# Patient Record
Sex: Female | Born: 1970
Health system: Southern US, Community
[De-identification: ages and names within clinical notes are randomized; demographics above are authoritative.]

## PROBLEM LIST (undated history)

## (undated) DIAGNOSIS — T783XXA Angioneurotic edema, initial encounter: Secondary | ICD-10-CM

## (undated) DIAGNOSIS — L509 Urticaria, unspecified: Secondary | ICD-10-CM

## (undated) DIAGNOSIS — D219 Benign neoplasm of connective and other soft tissue, unspecified: Secondary | ICD-10-CM

## (undated) DIAGNOSIS — E079 Disorder of thyroid, unspecified: Secondary | ICD-10-CM

## (undated) HISTORY — PX: UTERINE FIBROID SURGERY: SHX826

## (undated) HISTORY — DX: Disorder of thyroid, unspecified: E07.9

## (undated) HISTORY — DX: Benign neoplasm of connective and other soft tissue, unspecified: D21.9

## (undated) HISTORY — DX: Urticaria, unspecified: L50.9

## (undated) HISTORY — DX: Angioneurotic edema, initial encounter: T78.3XXA

---

## 2001-05-04 ENCOUNTER — Other Ambulatory Visit: Admission: RE | Admit: 2001-05-04 | Discharge: 2001-05-04 | Payer: Self-pay | Admitting: Obstetrics and Gynecology

## 2001-05-07 ENCOUNTER — Ambulatory Visit (HOSPITAL_COMMUNITY): Admission: RE | Admit: 2001-05-07 | Discharge: 2001-05-07 | Payer: Self-pay | Admitting: Internal Medicine

## 2001-05-07 ENCOUNTER — Encounter: Payer: Self-pay | Admitting: Obstetrics and Gynecology

## 2001-06-12 ENCOUNTER — Encounter: Payer: Self-pay | Admitting: Obstetrics and Gynecology

## 2001-06-12 ENCOUNTER — Ambulatory Visit (HOSPITAL_COMMUNITY): Admission: RE | Admit: 2001-06-12 | Discharge: 2001-06-12 | Payer: Self-pay | Admitting: Internal Medicine

## 2001-12-07 ENCOUNTER — Encounter: Payer: Self-pay | Admitting: Internal Medicine

## 2001-12-07 ENCOUNTER — Ambulatory Visit (HOSPITAL_COMMUNITY): Admission: RE | Admit: 2001-12-07 | Discharge: 2001-12-07 | Payer: Self-pay | Admitting: Internal Medicine

## 2004-12-03 ENCOUNTER — Ambulatory Visit (HOSPITAL_COMMUNITY): Admission: RE | Admit: 2004-12-03 | Discharge: 2004-12-03 | Payer: Self-pay | Admitting: Family Medicine

## 2004-12-27 ENCOUNTER — Ambulatory Visit: Payer: Self-pay | Admitting: Orthopedic Surgery

## 2005-01-01 ENCOUNTER — Encounter (HOSPITAL_COMMUNITY): Admission: RE | Admit: 2005-01-01 | Discharge: 2005-02-06 | Payer: Self-pay | Admitting: Orthopedic Surgery

## 2005-01-28 ENCOUNTER — Ambulatory Visit: Payer: Self-pay | Admitting: Orthopedic Surgery

## 2005-02-12 ENCOUNTER — Encounter (HOSPITAL_COMMUNITY): Admission: RE | Admit: 2005-02-12 | Discharge: 2005-03-14 | Payer: Self-pay | Admitting: Orthopedic Surgery

## 2005-12-18 ENCOUNTER — Ambulatory Visit (HOSPITAL_COMMUNITY): Admission: RE | Admit: 2005-12-18 | Discharge: 2005-12-18 | Payer: Self-pay | Admitting: Obstetrics and Gynecology

## 2006-03-06 ENCOUNTER — Inpatient Hospital Stay (HOSPITAL_COMMUNITY): Admission: RE | Admit: 2006-03-06 | Discharge: 2006-03-08 | Payer: Self-pay | Admitting: Obstetrics and Gynecology

## 2006-03-06 ENCOUNTER — Encounter (INDEPENDENT_AMBULATORY_CARE_PROVIDER_SITE_OTHER): Payer: Self-pay | Admitting: Specialist

## 2006-05-22 ENCOUNTER — Ambulatory Visit (HOSPITAL_COMMUNITY): Admission: RE | Admit: 2006-05-22 | Discharge: 2006-05-22 | Payer: Self-pay | Admitting: Obstetrics and Gynecology

## 2006-08-24 ENCOUNTER — Emergency Department (HOSPITAL_COMMUNITY): Admission: EM | Admit: 2006-08-24 | Discharge: 2006-08-24 | Payer: Self-pay | Admitting: Emergency Medicine

## 2007-08-07 ENCOUNTER — Other Ambulatory Visit: Admission: RE | Admit: 2007-08-07 | Discharge: 2007-08-07 | Payer: Self-pay | Admitting: Obstetrics and Gynecology

## 2008-03-30 ENCOUNTER — Ambulatory Visit (HOSPITAL_COMMUNITY): Admission: RE | Admit: 2008-03-30 | Discharge: 2008-03-30 | Payer: Self-pay | Admitting: Family Medicine

## 2008-08-09 ENCOUNTER — Other Ambulatory Visit: Admission: RE | Admit: 2008-08-09 | Discharge: 2008-08-09 | Payer: Self-pay | Admitting: Obstetrics and Gynecology

## 2008-11-29 ENCOUNTER — Ambulatory Visit (HOSPITAL_COMMUNITY): Admission: RE | Admit: 2008-11-29 | Discharge: 2008-11-29 | Payer: Self-pay | Admitting: Obstetrics and Gynecology

## 2009-08-30 ENCOUNTER — Other Ambulatory Visit: Admission: RE | Admit: 2009-08-30 | Discharge: 2009-08-30 | Payer: Self-pay | Admitting: Obstetrics and Gynecology

## 2009-11-29 ENCOUNTER — Ambulatory Visit (HOSPITAL_COMMUNITY): Admission: RE | Admit: 2009-11-29 | Discharge: 2009-11-29 | Payer: Self-pay | Admitting: Internal Medicine

## 2010-06-29 NOTE — H&P (Signed)
Tiffany Underwood, Tiffany Underwood               ACCOUNT NO.:  [id number]   MEDICAL RECORD NO.:  [id number]          PATIENT TYPE:  INP   LOCATION:  A417                          FACILITY:  [facility]   PHYSICIAN:  Tilda Burrow, M.D. DATE OF BIRTH:  08/05/1970   DATE OF ADMISSION:  03/06/2006  DATE OF DISCHARGE:  LH                              HISTORY & PHYSICAL   CHIEF COMPLAINT:  Symptomatic uterine fibroids, 14-16 week size.   HISTORY OF PRESENT ILLNESS:  This 40 year old female gravida 1 para 0  AB1 with ultrasound-confirmed large uterine fibroids is admitted for  multiple myomectomy to solve severe pelvic pain and eliminate fibroids.  Future fertility is desired, so she is not a hysterectomy candidate.  She is in the follicular phase of her cycle, with LMP February 26, 2006.  She has had a hysterosalpingogram confirming that there are no fibroids  inside the endometrium, but there is deformity of the endometrial cavity  due to posterior and fundal fibroids, with flattening of the contours of  the uterus.   PAST MEDICAL HISTORY:  Hypothyroidism.   SURGICAL HISTORY:  D&C in 68 for miscarriage.   SOCIAL HISTORY:  Married.  Employed.  Stable relationship x years.   DRUG ALLERGIES:  None known.   MEDICATIONS:  Synthroid 0.15 mg daily.   PHYSICAL EXAMINATION:  GENERAL:  Healthy-appearing African American  female, alert, oriented x 3.  Height 5 feet 5 inches, weight 167 pounds,  blood pressure 140/90, pulse 70s, respirations 16.  HEENT:  Pupils equal, round, and reactive.  NECK:  Supple.  CHEST:  Clear to auscultation.  ABDOMEN:  Nontender, with fundus of the uterus palpable to the right of  the midline for the largest of the fibroids.  Tender to palpation at our  pressure contact on the abdomen.  PELVIC:  Cervix normal.  GC and Chlamydia cultures recently negative.  Good pelvic support.  Normal external genitalia.  EXTREMITIES:  Within normal limits.   LABORATORY DATA:  Hemoglobin 13,  hematocrit 40.  Albumin 3.5.  Blood  type A positive.  Quantitative HCG negative.   IMPRESSION:  Symptomatic uterine fibroids.   PLAN:  Multiple myomectomy.      Tilda Burrow, M.D.  Electronically Signed     JVF/MEDQ  D:  03/07/2006  T:  03/07/2006  Job:  [id number]   cc:   [facility]

## 2010-06-29 NOTE — Op Note (Signed)
Tiffany Underwood, Tiffany Underwood               ACCOUNT NO.:  [id number]   MEDICAL RECORD NO.:  [id number]          PATIENT TYPE:  INP   LOCATION:  A417                          FACILITY:  [facility]   PHYSICIAN:  Tilda Burrow, M.D. DATE OF BIRTH:  1970/12/06   DATE OF PROCEDURE:  03/06/2006  DATE OF DISCHARGE:                               OPERATIVE REPORT   PREOPERATIVE DIAGNOSIS:  Symptomatic uterine fibroids, 14 weeks' size.   POSTOPERATIVE DIAGNOSIS:  Symptomatic uterine fibroids, 14 weeks' size.   PROCEDURE:  Multiple myomectomy (x5).   SURGEON:  Tilda Burrow, M.D.   ASSISTANTAnnabell Howells, R.N.   ANESTHESIA:  General.   COMPLICATIONS:  None.   FINDINGS:  Posterior fundal intramural fibroid 3 cm in size, anterior  lower uterine segment fibroid 1 x 1 x 2 cm, three apical fibroids in the  fundus portion, the largest of which, baseball-sized, located very close  to the insertion of the right tube and to the uterine body.   DETAILS OF PROCEDURE:  The patient was taken to the operating room,  prepped and draped for lower abdominal surgery with Pfannenstiel  incision performed in standard fashion with the use and elastic  retractor positioned for bowel position.  A few small thin, filmy  adhesions from the omentum to the anterior abdominal wall were  encountered and released.  Attention was directed to the uterus, which  could be elevated out of the pelvis and inspected.  There was a huge,  large, broad-based fibroid at the right cornual area just in front of  and medial to the insertion of the right fallopian tube.  There were two  additional pedunculated fibroids on the fundus and then palpable  intramural fibroids anteriorly and posteriorly as described in the HPI.  The larger fibroid was left until last.  The two fundal fibroids were  able to be taken down without difficulty.  The two fundal broad-based  pedunculated fibroids were taken down without difficulty.  Dexon 2-0 was  sewn  just beneath the peritoneum at the base of the stalk and used to  ligate these two with the with needle-needle tip cautery used.  The  stump could be then inverted to leave maximal peritoneal covering of the  stump on the uterine apex.  The posterior fibroid was then addressed.  By going to the midline on the posterior fundal side of the uterus,  making a 1.5 cm long J-shaped incision in the midline posterior portion  of the uterine body, reaching the fibroid, which was able to be grasped  at one end with an Allis clamp and gradually extracted while being  careful and peel it out minimal disruption of myometrium.  The sides  were pulled together in the midline with 2-0 and 3-0 Dexon sutures, with  excellent hemostasis.  Prior to the dissection, the myometrium was  infiltrated with dilute Pitressin solution 20 units and 100 mL for  improved hemostasis.  Again, the peritoneal surfaces were closed in a  subcuticular fashion with minimal exposure of the edges.   On the anterior lower uterine segment, there was  a similar fibroid  located about midway in the length of the uterus.  Again, in order to  access this a small J-shaped incision approximately 1 cm in length was  performed and the fibroid identified.  A small suture of 0 chromic  sutured into the body of the fibroid and placed on traction and then  careful peeling out of the fibroid from the anterior midportion to the  midportion of the uterine body.  There was no suspicion of entering of  the peritoneal cavity.  The potential space was obliterated with 2-0 and  3-0 Dexon in a similar fashion with good hemostasis achieved.  Again,  Pitressin had been used prior to the procedure.   Finally the large fundal fibroid was addressed.  It had extensive  varicosities over its surface and protruded from a large base  approximately 3 cm in diameter, with fibroid itself measuring  approximately 7-8 cm in diameter.  The peritoneum was opened in  an  elliptical fashion and then the body of the fibroid identified.  Careful  sharp dissection with Metzenbaum scissors and needle-tip cautery as  necessary was then performed to achieve excision of the specimen.  Particular care was taken to use sharp dissection in the area closest to  the fallopian tube on that side.  We were able to peel it out with  apparent avoidance of the tube itself.  The base, however, showed a  rather significant tendency to ooze and so the closure of the base of  the specimen required placement of interrupted and running 2-0 and 3-0  Dexon sutures placed in such a way as to be obscured by the peritoneum.  We were particularly careful to avoid the area of where the tube was  felt lie, though there was some oozing in this area and distortion was  present.  The peritoneal surface was quite redundant in this area after  the peeling out of the fibroid and was trimmed to reduce the overlap.  Nonetheless, there was plenty and the peritoneum was repositioned to  cover over the pedicle base with as careful attention to hemostasis as  possible.  The varicosities along the surface did require point cautery  from the underneath side prior to closure.  At the end of the case, the  surfaces were erythematous but with no major disruptions of the  peritoneal surfaces.  Irrigation was performed.  Inspection visually  showed hemostasis to be good at completion of the procedure with  approximately 100 mL estimated blood loss, almost all from the right  cornual area.  The patient then had irrigation of the pelvis with saline  left intraperitoneal, with perioperative antibiotics administered.  Sponge and needle counts correct.  The closure of the peritoneal cavity  was easily accomplished with 2-0 Dexon closure of the peritoneum, 0  Vicryl closure of the fascia, irrigation and inspection of the subcu fatty space with closure of the potential space and staple closure of  the skin.   Sponge and needle counts were correct.  The patient tolerated  the procedure well, went to the recovery room in good condition.,      Tilda Burrow, M.D.  Electronically Signed     JVF/MEDQ  D:  03/07/2006  T:  03/07/2006  Job:  [id number]

## 2010-06-29 NOTE — Discharge Summary (Signed)
NAMENYJAI, GRAFF               ACCOUNT NO.:  [id number]   MEDICAL RECORD NO.:  [id number]          PATIENT TYPE:  INP   LOCATION:  A417                          FACILITY:  [facility]   PHYSICIAN:  Lazaro Arms, M.D.   DATE OF BIRTH:  [birth year]   DATE OF ADMISSION:  03/06/2006  DATE OF DISCHARGE:  01/26/2008LH                               DISCHARGE SUMMARY   DISCHARGE DIAGNOSIS:  Status post abdominal myomectomy.   PROCEDURES:  Abdominal myomectomy, multiple.   Please refer to the history and physical for details of admission to the  hospital.   HOSPITAL COURSE:  The patient was admitted to undergo multiple  myomectomy due to a symptomatic 14 to 16 weeks size uterus.  Her  hemoglobin preoperatively was 13 and her hematocrit was 40.  Her  interoperative course was unremarkable.  Postoperative day one, she had  a hemoglobin 11.5 and hematocrit 34.7, and a white count of 8900.  She  was afebrile throughout the postoperative course.  She tolerated clear  liquids and then a regular diet.  She voided without symptoms.  She  tolerated her transitional oral pain medicine and she was extensively  ambulatory.  Additionally, she had flatus prior to discharge.  On the  morning of discharge, her abdomen was benign, soft, appropriate for  postop, her incision is clean, dry, and intact.  She was discharged to  home on March 08, 2006, postop day two, to follow up in the office  next Friday to have her staples removed and the incision evaluated.  She  has been given instructions and precautions for return prior to that  time.  She is given Vicodin #40 and Phenergan #12 as discharge  medications.      Lazaro Arms, M.D.  Electronically Signed     LHE/MEDQ  D:  03/08/2006  T:  03/08/2006  Job:  [id number]

## 2010-06-29 NOTE — Op Note (Signed)
Tiffany Underwood, Tiffany Underwood               ACCOUNT NO.:  [id number]   MEDICAL RECORD NO.:  [id number]           PATIENT TYPE:  OUT   LOCATION:  RAD                           FACILITY:  [facility]   PHYSICIAN:  Tilda Burrow, M.D. DATE OF BIRTH:  Mar 28, 1970   DATE OF PROCEDURE:  12/18/2005  DATE OF DISCHARGE:                                 OPERATIVE REPORT   This is a hysterosalpingogram performed in radiology on December 18, 2005.   PROCEDURE INDICATION:  Known uterine fibroids, 14 weeks size, having  evaluation for consideration of multiple myomectomy.  The patient has been  on Nuvaring until recently and had a chest tube performed.   Exam shows good vaginal length.  Cervix is nonpurulent.  It is prepped and  draped and grasped with a single tooth tenaculum with catheterization of the  endocervical canal performed.  HSG has been initiated with prompt spillage  out each tube at 3-4 mL of fluid volume.  The uterus is manipulated around  and as the cervix comes anteriorly there is some flattening of the posterior  aspects of the uterine fundus due to the presence of the posterior  intramural fibroid.  There is some slight shaggy irregularity on the left  side felt to represent endometrial decidua as she has only recently stopped  her Nuvaring, and she has not actually had a period since her quantitative  HCG of course was negative yesterday.   IMPRESSION:  1. Bilateral tubal patency.  2. Symmetric flattening of the posterior uterine fundus secondary to      enlarged fibroids.  3. No evidence of polypoid fibroids or anything severely deforming the      endometrial cavity.   PLAN:  Given the large size of the uterine fibroids myomectomy is to be  considered.  We will discuss with the patient and follow up next week.  Doxycycline 100 mg b.i.d. x5 days given to the patient.      Tilda Burrow, M.D.  Electronically Signed     JVF/MEDQ  D:  12/23/2005  T:  12/23/2005  Job:  [id number]

## 2010-09-17 ENCOUNTER — Other Ambulatory Visit: Payer: Self-pay | Admitting: Obstetrics and Gynecology

## 2010-09-19 ENCOUNTER — Other Ambulatory Visit (HOSPITAL_COMMUNITY)
Admission: RE | Admit: 2010-09-19 | Discharge: 2010-09-19 | Disposition: A | Discharge status: Home / Self Care | Payer: BC Managed Care – PPO | Source: Ambulatory Visit | Attending: Obstetrics and Gynecology | Admitting: Obstetrics and Gynecology

## 2010-09-19 ENCOUNTER — Other Ambulatory Visit: Payer: Self-pay | Admitting: Obstetrics and Gynecology

## 2010-09-19 DIAGNOSIS — Z01419 Encounter for gynecological examination (general) (routine) without abnormal findings: Secondary | ICD-10-CM | POA: Insufficient documentation

## 2011-06-04 ENCOUNTER — Other Ambulatory Visit: Payer: Self-pay | Admitting: Obstetrics and Gynecology

## 2011-06-04 DIAGNOSIS — Z139 Encounter for screening, unspecified: Secondary | ICD-10-CM

## 2011-06-10 ENCOUNTER — Ambulatory Visit (HOSPITAL_COMMUNITY)
Admission: RE | Admit: 2011-06-10 | Discharge: 2011-06-10 | Disposition: A | Discharge status: Home / Self Care | Payer: BC Managed Care – PPO | Source: Ambulatory Visit | Attending: Obstetrics and Gynecology | Admitting: Obstetrics and Gynecology

## 2011-06-10 DIAGNOSIS — Z1231 Encounter for screening mammogram for malignant neoplasm of breast: Secondary | ICD-10-CM | POA: Insufficient documentation

## 2011-06-10 DIAGNOSIS — Z139 Encounter for screening, unspecified: Secondary | ICD-10-CM

## 2011-08-17 ENCOUNTER — Emergency Department (HOSPITAL_COMMUNITY)
Admission: EM | Admit: 2011-08-17 | Discharge: 2011-08-17 | Disposition: A | Discharge status: Home / Self Care | Payer: BC Managed Care – PPO | Attending: Emergency Medicine | Admitting: Emergency Medicine

## 2011-08-17 ENCOUNTER — Encounter (HOSPITAL_COMMUNITY): Payer: Self-pay | Admitting: *Deleted

## 2011-08-17 DIAGNOSIS — X58XXXA Exposure to other specified factors, initial encounter: Secondary | ICD-10-CM | POA: Insufficient documentation

## 2011-08-17 DIAGNOSIS — S0920XA Traumatic rupture of unspecified ear drum, initial encounter: Secondary | ICD-10-CM | POA: Insufficient documentation

## 2011-08-17 DIAGNOSIS — H7290 Unspecified perforation of tympanic membrane, unspecified ear: Secondary | ICD-10-CM

## 2011-08-17 MED ORDER — HYDROCODONE-ACETAMINOPHEN 5-325 MG PO TABS: 2.0000 | ORAL_TABLET | ORAL | Status: AC | PRN

## 2011-08-17 MED ORDER — OFLOXACIN 0.3 % OT SOLN: 5.0000 [drp] | Freq: Two times a day (BID) | OTIC | Status: AC

## 2011-08-17 NOTE — ED Provider Notes (Signed)
History     CSN: [id number]  Arrival date & time 08/17/11  1228   First MD Initiated Contact with Patient 08/17/11 1256      Chief Complaint  Patient presents with  . Otalgia    (Consider location/radiation/quality/duration/timing/severity/associated sxs/prior treatment) HPI Comments: Patient complains of right ear pain for the past hour after she was using a Q-tip on the ear and believes he went too far in. She hasn't developed a sore throat and pain with swallowing. She denies any hearing loss, dizziness and ringing in the ears fever, vomiting or chills. No problems with the ear before using a Q-tip. She noticed some blood on the Q-tip and some bleeding from the ear afterwards.  Patient is a 41 y.o. female presenting with ear pain. The history is provided by the patient.  Otalgia This is a new problem. The current episode started 1 to 2 hours ago. There is pain in the right ear. The problem occurs constantly. The problem has not changed since onset.There has been no fever. The pain is moderate. Associated symptoms include ear discharge and sore throat. Pertinent negatives include no headaches, no hearing loss, no abdominal pain, no vomiting and no cough. Her past medical history does not include hearing loss.    History reviewed. No pertinent past medical history.  Past Surgical History  Procedure Date  . Uterine fibroid surgery     No family history on file.  History  Substance Use Topics  . Smoking status: Never Smoker   . Smokeless tobacco: Not on file  . Alcohol Use: No    OB History    Grav Para Term Preterm Abortions TAB SAB Ect Mult Living                  Review of Systems  Constitutional: Negative for activity change and appetite change.  HENT: Positive for ear pain, sore throat and ear discharge. Negative for hearing loss and trouble swallowing.   Respiratory: Negative for cough and shortness of breath.   Cardiovascular: Negative for chest pain.    Gastrointestinal: Negative for nausea, vomiting and abdominal pain.  Genitourinary: Negative for dysuria, vaginal bleeding and vaginal discharge.  Musculoskeletal: Negative for back pain.  Skin: Negative for pallor.  Neurological: Negative for dizziness and headaches.    Allergies  Review of patient's allergies indicates no known allergies.  Home Medications  No current outpatient prescriptions on file.  BP 121/77  Pulse 70  Temp 98.3 F (36.8 C) (Oral)  Resp 16  Ht 5\' 7"  (1.702 m)  Wt 170 lb (77.111 kg)  BMI 26.63 kg/m2  SpO2 100%  LMP 08/17/2011  Physical Exam  Constitutional: She is oriented to person, place, and time. She appears well-developed and well-nourished. No distress.  HENT:  Head: Normocephalic and atraumatic.  Left Ear: External ear normal.  Mouth/Throat: Oropharynx is clear and moist. No oropharyngeal exudate.       Right tympanic membrane rupture with blood in the ear canal and visible ossicles  Eyes: Conjunctivae and EOM are normal. Pupils are equal, round, and reactive to light.  Neck: Normal range of motion. Neck supple.  Cardiovascular: Normal rate, regular rhythm and normal heart sounds.   Pulmonary/Chest: Effort normal and breath sounds normal. No respiratory distress.  Abdominal: Soft. There is no tenderness. There is no rebound and no guarding.  Musculoskeletal: Normal range of motion. She exhibits no tenderness.  Neurological: She is alert and oriented to person, place, and time. No cranial nerve deficit.  Skin: Skin is warm.    ED Course  Procedures (including critical care time)  Labs Reviewed - No data to display No results found.   No diagnosis found.    MDM  Right tympanic membrane rupture from blunt trauma. No hearing loss, dizziness, vomiting or nausea.  Topical antibiotics, followup with ENT.       Glynn Octave, MD 08/17/11 [phone]

## 2011-08-17 NOTE — ED Notes (Signed)
Pt states a Q-tip went too far in her right ear ~ 1 hour PTA. Sore throat began directly after incident. NAD

## 2011-10-02 ENCOUNTER — Other Ambulatory Visit (HOSPITAL_COMMUNITY)
Admission: RE | Admit: 2011-10-02 | Discharge: 2011-10-02 | Disposition: A | Discharge status: Home / Self Care | Payer: BC Managed Care – PPO | Source: Ambulatory Visit | Attending: Obstetrics and Gynecology | Admitting: Obstetrics and Gynecology

## 2011-10-02 ENCOUNTER — Other Ambulatory Visit: Payer: Self-pay | Admitting: Obstetrics and Gynecology

## 2011-10-02 DIAGNOSIS — Z01419 Encounter for gynecological examination (general) (routine) without abnormal findings: Secondary | ICD-10-CM | POA: Insufficient documentation

## 2011-10-07 ENCOUNTER — Other Ambulatory Visit: Payer: Self-pay | Admitting: Obstetrics and Gynecology

## 2012-06-10 ENCOUNTER — Other Ambulatory Visit (HOSPITAL_COMMUNITY): Payer: Self-pay | Admitting: Family Medicine

## 2012-06-10 DIAGNOSIS — Z139 Encounter for screening, unspecified: Secondary | ICD-10-CM

## 2012-06-15 ENCOUNTER — Ambulatory Visit (HOSPITAL_COMMUNITY)
Admission: RE | Admit: 2012-06-15 | Discharge: 2012-06-15 | Disposition: A | Discharge status: Home / Self Care | Payer: BC Managed Care – PPO | Source: Ambulatory Visit | Attending: Family Medicine | Admitting: Family Medicine

## 2012-06-15 DIAGNOSIS — Z139 Encounter for screening, unspecified: Secondary | ICD-10-CM

## 2012-06-15 DIAGNOSIS — Z1231 Encounter for screening mammogram for malignant neoplasm of breast: Secondary | ICD-10-CM | POA: Insufficient documentation

## 2012-06-30 ENCOUNTER — Encounter: Payer: Self-pay | Admitting: Adult Health

## 2012-06-30 ENCOUNTER — Ambulatory Visit (INDEPENDENT_AMBULATORY_CARE_PROVIDER_SITE_OTHER): Payer: BC Managed Care – PPO | Admitting: Adult Health

## 2012-06-30 VITALS — BP 120/80 | Ht 67.0 in | Wt 168.8 lb

## 2012-06-30 DIAGNOSIS — N898 Other specified noninflammatory disorders of vagina: Secondary | ICD-10-CM

## 2012-06-30 DIAGNOSIS — R319 Hematuria, unspecified: Secondary | ICD-10-CM

## 2012-06-30 DIAGNOSIS — N390 Urinary tract infection, site not specified: Secondary | ICD-10-CM

## 2012-06-30 DIAGNOSIS — D259 Leiomyoma of uterus, unspecified: Secondary | ICD-10-CM

## 2012-06-30 LAB — POCT URINALYSIS DIPSTICK

## 2012-06-30 LAB — POCT WET PREP (WET MOUNT): Trichomonas Wet Prep HPF POC: NEGATIVE

## 2012-06-30 MED ORDER — SULFAMETHOXAZOLE-TRIMETHOPRIM 800-160 MG PO TABS: 1.0000 | ORAL_TABLET | Freq: Two times a day (BID) | ORAL | Status: DC

## 2012-06-30 MED ORDER — PHENAZOPYRIDINE HCL 200 MG PO TABS: 200.0000 mg | ORAL_TABLET | Freq: Three times a day (TID) | ORAL | Status: DC | PRN

## 2012-06-30 NOTE — Patient Instructions (Addendum)
Increase water Take septra ds and pyridium Call prn Sign up my chart

## 2012-06-30 NOTE — Progress Notes (Signed)
Subjective:     Patient ID: Tiffany Underwood, female   DOB: 1970/03/24, 42 y.o.   MRN: [id number]  HPI Tiffany Underwood is a 42 year old black female in complaining of having to void frequently and some irritation.  Review of Systems Patient denies any headaches, blurred vision, shortness of breath, chest pain, abdominal pain, problems with bowel movements or intercourse. No mood changes, Positives as in HPI. She just took her nuva ring out, so period due.    Reviewed past medical,surgical, social and family history. Reviewed medications and allergies.  Objective:   Physical Exam Blood pressure 120/80, height 5\' 7"  (1.702 m), weight 168 lb 12.8 oz (76.567 kg), last menstrual period 06/09/2012. Urine: 3+ blood,1+wbc, trace protein Skin warm and dry.Pelvic: external genitalia is normal in appearance, vagina: white discharge, cervix:smooth and bulbous, uterus: slightly enlarged,? Anterior fibroid, non tender, adnexa: no masses or tenderness noted. Wet prep: negative      Assessment:      UTI Hematuria Vaginal discharge   Uterine fibroid  Plan:      UA C&S Increase water Rx septra DS 1 bid x 7 days and pyridium 200 mg 1 tid x 3 days Call any problems, can see results on my chart

## 2012-07-01 ENCOUNTER — Telehealth: Payer: Self-pay | Admitting: Adult Health

## 2012-07-01 LAB — URINALYSIS
Bilirubin Urine: NEGATIVE
Ketones, ur: NEGATIVE mg/dL
Nitrite: NEGATIVE
pH: 6 (ref 5.0–8.0)

## 2012-07-01 NOTE — Telephone Encounter (Signed)
Complains of burning still, stop pyridium and try uribel 1 qid x 3 days, samples given Number of samples 12  Lot number [id number]   Exp date 5/14

## 2012-07-02 ENCOUNTER — Telehealth: Payer: Self-pay | Admitting: Adult Health

## 2012-07-02 NOTE — Telephone Encounter (Signed)
Left message

## 2012-07-03 ENCOUNTER — Telehealth: Payer: Self-pay | Admitting: Adult Health

## 2012-07-03 NOTE — Telephone Encounter (Signed)
Left message that I was calling to check on her and to call me back.

## 2012-07-03 NOTE — Telephone Encounter (Signed)
Pt called me back, see result note, on urine culture

## 2012-10-07 ENCOUNTER — Ambulatory Visit (INDEPENDENT_AMBULATORY_CARE_PROVIDER_SITE_OTHER): Payer: BC Managed Care – PPO | Admitting: Obstetrics and Gynecology

## 2012-10-07 ENCOUNTER — Encounter: Payer: Self-pay | Admitting: Obstetrics and Gynecology

## 2012-10-07 ENCOUNTER — Other Ambulatory Visit (HOSPITAL_COMMUNITY)
Admission: RE | Admit: 2012-10-07 | Discharge: 2012-10-07 | Disposition: A | Discharge status: Home / Self Care | Payer: BC Managed Care – PPO | Source: Ambulatory Visit | Attending: Obstetrics and Gynecology | Admitting: Obstetrics and Gynecology

## 2012-10-07 VITALS — BP 118/70 | Ht 67.0 in | Wt 165.0 lb

## 2012-10-07 DIAGNOSIS — Z1151 Encounter for screening for human papillomavirus (HPV): Secondary | ICD-10-CM | POA: Insufficient documentation

## 2012-10-07 DIAGNOSIS — Z01419 Encounter for gynecological examination (general) (routine) without abnormal findings: Secondary | ICD-10-CM | POA: Insufficient documentation

## 2012-10-07 MED ORDER — ETONOGESTREL-ETHINYL ESTRADIOL 0.12-0.015 MG/24HR VA RING: VAGINAL_RING | VAGINAL | Status: DC

## 2012-10-07 NOTE — Progress Notes (Signed)
  Assessment:  Annual Gyn Exam   Plan:  1. pap smear done, next pap due 50yr 2. return annually or prn contr management 3    Annual mammogram advised maternal risk factor Ca Breast Subjective:  Tiffany Underwood is a 42 y.o. female G1P1 who presents for annual exam. Patient's last menstrual period was 09/30/2012. The patient has complaints today of none,   The following portions of the patient's history were reviewed and updated as appropriate: allergies, current medications, past family history, past medical history, past social history, past surgical history and problem list.  Review of Systems Constitutional: negative Gastrointestinal: negative Genitourinary: no sui, no ui, menses on nuvaring 4-5days  Objective:  BP 118/70  Ht 5\' 7"  (1.702 m)  Wt 165 lb (74.844 kg)  BMI 25.84 kg/m2  LMP 09/30/2012   BMI: Body mass index is 25.84 kg/(m^2).  General Appearance: Alert, appropriate appearance for age. No acute distress HEENT: Grossly normal Neck / Thyroid:  Cardiovascular: RRR; normal S1, S2, no murmur Lungs: CTA bilaterally Back: No CVAT Breast Exam: No masses or nodes.No dimpling, nipple retraction or discharge. Gastrointestinal: Soft, non-tender, no masses or organomegaly Pelvic Exam: Vulva and vagina appear normal. Bimanual exam reveals normal uterus and adnexa. Rectovaginal: will send hemoccult cards as pt bled c pap swipe. Lymphatic Exam: Non-palpable nodes in neck, clavicular, axillary, or inguinal regions Skin: no rash or abnormalities Neurologic: Normal gait and speech, no tremor  Psychiatric: Alert and oriented, appropriate affect.  Urinalysis:Not done  Christin Bach. MD Pgr [phone] 10:57 AM

## 2013-05-21 ENCOUNTER — Other Ambulatory Visit (HOSPITAL_COMMUNITY): Payer: Self-pay | Admitting: Family Medicine

## 2013-05-21 DIAGNOSIS — Z1231 Encounter for screening mammogram for malignant neoplasm of breast: Secondary | ICD-10-CM

## 2013-06-21 ENCOUNTER — Ambulatory Visit (HOSPITAL_COMMUNITY)
Admission: RE | Admit: 2013-06-21 | Discharge: 2013-06-21 | Disposition: A | Discharge status: Home / Self Care | Payer: BC Managed Care – PPO | Source: Ambulatory Visit | Attending: Family Medicine | Admitting: Family Medicine

## 2013-06-21 DIAGNOSIS — Z1231 Encounter for screening mammogram for malignant neoplasm of breast: Secondary | ICD-10-CM | POA: Insufficient documentation

## 2013-07-16 ENCOUNTER — Encounter (HOSPITAL_COMMUNITY): Payer: Self-pay | Admitting: Emergency Medicine

## 2013-07-16 ENCOUNTER — Emergency Department (HOSPITAL_COMMUNITY)
Admission: EM | Admit: 2013-07-16 | Discharge: 2013-07-16 | Disposition: A | Discharge status: Home / Self Care | Payer: BC Managed Care – PPO | Source: Home / Self Care

## 2013-07-16 DIAGNOSIS — T7840XA Allergy, unspecified, initial encounter: Secondary | ICD-10-CM

## 2013-07-16 DIAGNOSIS — T50995A Adverse effect of other drugs, medicaments and biological substances, initial encounter: Secondary | ICD-10-CM

## 2013-07-16 DIAGNOSIS — I10 Essential (primary) hypertension: Secondary | ICD-10-CM

## 2013-07-16 MED ORDER — METHYLPREDNISOLONE ACETATE 80 MG/ML IJ SUSP
80.0000 mg | Freq: Once | INTRAMUSCULAR | Status: AC
  Administered 2013-07-16: 80 mg via INTRAMUSCULAR

## 2013-07-16 MED ORDER — CIPROFLOXACIN HCL 500 MG PO TABS: 500.0000 mg | ORAL_TABLET | Freq: Two times a day (BID) | ORAL | Status: DC

## 2013-07-16 MED ORDER — METHYLPREDNISOLONE ACETATE 80 MG/ML IJ SUSP
INTRAMUSCULAR | Status: AC
  Filled 2013-07-16: qty 1

## 2013-07-16 MED ORDER — PREDNISONE 20 MG PO TABS: ORAL_TABLET | ORAL | Status: DC

## 2013-07-16 MED ORDER — CIMETIDINE 400 MG PO TABS: 400.0000 mg | ORAL_TABLET | Freq: Two times a day (BID) | ORAL | Status: DC

## 2013-07-16 NOTE — ED Notes (Signed)
Pt c/o poss allergic reaction to med onset today Sx include Hives all over body Reports she's been taking left over Antibiotics/Nitrofuantion/Mac for UTI she thought she was having Denies f/v/n/d, cold sx, dyspnea Alert w/no signs of acute distress.

## 2013-07-16 NOTE — Discharge Instructions (Signed)
Take Benadryl 25 mg every 4 hours while awake or Zyrtec 10 mg daily or Claritin 10 mg daily or Allegra 180 mg daily.   If you have any trouble breathing to to nearest ER via ambulance.

## 2013-07-16 NOTE — ED Provider Notes (Signed)
  Chief Complaint    Chief Complaint  Patient presents with  . Allergic Reaction    History of Present Illness      Tiffany Underwood is a 43 year old female who has been on Macrobid for the past 5 days for urinary tract infection. She stopped at 48 hours ago. This morning she broke out in a pruritic rash on her arms and legs. She is itching all over including her trunk on her face but there is no rash there. She has not had any difficulty breathing, chest tightness, wheezing, or cough. She denies any swelling of the lips, tongue, or throat. She is allergic to Septra to which she reacted in a similar manner. She is still having some minimal dysuria and she denies any fever or chills. No abdominal or back pain. She has not been exposed to any other obvious allergens or antigens.  Review of Systems   Other than as noted above, the patient denies any of the following symptoms: Systemic:  No fever or chills. ENT:  No nasal congestion, rhinorrhea, sore throat, swelling of lips, tongue or throat. Resp:  No cough, wheezing, or shortness of breath.  [place]    Past medical history, family history, social history, meds, and allergies were reviewed. She takes Synthroid for hypothyroidism.  Physical Exam     Vital signs:  BP 134/80  Pulse 71  Temp(Src) 98.5 F (36.9 C) (Oral)  Resp 16  SpO2 100%  LMP 06/21/2013 Gen:  Alert, oriented, in no distress. ENT:  Pharynx clear, no intraoral lesions, moist mucous membranes. Lungs:  Clear to auscultation. Skin:  She has erythematous maculopapules on both lower legs in both forearms. She does not have any hives. Trunk and face are clear.  Course in Urgent [place]     The following meds were given:  Medications  methylPREDNISolone acetate (DEPO-MEDROL) injection 80 mg (80 mg Intramuscular Given 07/16/13 1507)   Assessment    The encounter diagnosis was Allergic reaction to drug.  Probable allergic reaction to Macrodantin. This was entered into  her chart as allergic reaction.  Plan     1.  Meds:  The following meds were prescribed:   New Prescriptions   CIMETIDINE (TAGAMET) 400 MG TABLET    Take 1 tablet (400 mg total) by mouth 2 (two) times daily.   CIPROFLOXACIN (CIPRO) 500 MG TABLET    Take 1 tablet (500 mg total) by mouth every 12 (twelve) hours.   PREDNISONE (DELTASONE) 20 MG TABLET    Take 3 daily for 5 days, 2 daily for 5 days, 1 daily for 5 days.   Also suggested she take an H1 antihistamine including Benadryl, or if she needed to be more alert she can take Claritin, Zyrtec, or fexofenadine.  2.  Patient Education/Counseling:  The patient was given appropriate handouts, self care instructions, and instructed in symptomatic relief.  Warned not to take any further sulfa or Macrodantin products in the future. She was provided with a prescription for Cipro to take if she should have further urinary symptoms.  3.  Follow up:  The patient was told to follow up here if no better in 3 to 4 days, or sooner if becoming worse in any way, and given some red flag symptoms such as worsening rash, fever, or difficulty breathing which would prompt immediate return.  Follow up here if necessary.      Harden Mo, MD 07/16/13 [phone]

## 2013-10-06 ENCOUNTER — Other Ambulatory Visit: Payer: Self-pay | Admitting: Obstetrics and Gynecology

## 2013-10-19 ENCOUNTER — Ambulatory Visit (INDEPENDENT_AMBULATORY_CARE_PROVIDER_SITE_OTHER): Payer: BC Managed Care – PPO | Admitting: Obstetrics and Gynecology

## 2013-10-19 ENCOUNTER — Encounter: Payer: Self-pay | Admitting: Obstetrics and Gynecology

## 2013-10-19 VITALS — BP 120/80 | Ht 67.0 in | Wt 172.0 lb

## 2013-10-19 DIAGNOSIS — Z1212 Encounter for screening for malignant neoplasm of rectum: Secondary | ICD-10-CM

## 2013-10-19 DIAGNOSIS — R350 Frequency of micturition: Secondary | ICD-10-CM

## 2013-10-19 DIAGNOSIS — Z01419 Encounter for gynecological examination (general) (routine) without abnormal findings: Secondary | ICD-10-CM

## 2013-10-19 LAB — POCT URINALYSIS DIPSTICK
Glucose, UA: NEGATIVE
Ketones, UA: NEGATIVE
Leukocytes, UA: NEGATIVE
NITRITE UA: NEGATIVE
PROTEIN UA: NEGATIVE

## 2013-10-19 LAB — HEMOCCULT GUIAC POC 1CARD (OFFICE): Fecal Occult Blood, POC: NEGATIVE

## 2013-10-19 NOTE — Progress Notes (Signed)
Patient ID: Tiffany Underwood, female   DOB: 06/02/1970, 43 y.o.   MRN: [id number] Pt here today for annual exam and states that she feels like she may have an UTI. Pt states that she has the urge to go more than usual, pt states that its been like this for a couple days. Pt denies any other problems or concerns at this time.  Subjective:     Tiffany Underwood is a 43 y.o. female here for a routine exam.  Current complaints: none except occasional pain .  Personal health questionnaire reviewed: yes.   Gynecologic History Patient's last menstrual period was 10/05/2013. Contraception: NuvaRing vaginal inserts Last Pap: 2014. Results were: normal Last mammogram: 2015. Results were: normal  Obstetric History OB History  Gravida Para Term Preterm AB SAB TAB Ectopic Multiple Living  1 1        1     # Outcome Date GA Lbr Len/2nd Weight Sex Delivery Anes PTL Lv  1 PAR 73    M SVD   Y         Review of Systems  Review of Systems  Constitutional: Negative for fever, chills, weight loss, malaise/fatigue and diaphoresis.  HENT: Negative for hearing loss, ear pain, nosebleeds, congestion, sore throat, neck pain, tinnitus and ear discharge.   Eyes: Negative for blurred vision, double vision, photophobia, pain, discharge and redness.  Respiratory: Negative for cough, hemoptysis, sputum production, shortness of breath, wheezing and stridor.   Cardiovascular: Negative for chest pain, palpitations, orthopnea, claudication, leg swelling and PND.  Gastrointestinal: negative for abdominal pain. Negative for heartburn, nausea, vomiting, diarrhea, constipation, blood in stool and melena.  Genitourinary: Negative for dysuria, urgency, frequency, hematuria and flank pain.  Musculoskeletal: Negative for myalgias, back pain, joint pain and falls.  Skin: Negative for itching and rash.  Neurological: Negative for dizziness, tingling, tremors, sensory change, speech change, focal weakness, seizures, loss of  consciousness, weakness and headaches.  Endo/Heme/Allergies: Negative for environmental allergies and polydipsia. Does not bruise/bleed easily.  Psychiatric/Behavioral: Negative for depression, suicidal ideas, hallucinations, memory loss and substance abuse. The patient is not nervous/anxious and does not have insomnia.        Objective:    Physical Exam  Vitals reviewed. Constitutional: She is oriented to person, place, and time. She appears well-developed and well-nourished.  HENT:  Head: Normocephalic and atraumatic.        Right Ear: External ear normal.  Left Ear: External ear normal.  Nose: Nose normal.  Mouth/Throat: Oropharynx is clear and moist.  Eyes: Conjunctivae and EOM are normal. Pupils are equal, round, and reactive to light. Right eye exhibits no discharge. Left eye exhibits no discharge. No scleral icterus.  Neck: Normal range of motion. Neck supple. No tracheal deviation present. No thyromegaly present.  Cardiovascular: Normal rate, regular rhythm, normal heart sounds and intact distal pulses.  Exam reveals no gallop and no friction rub.   No murmur heard. Respiratory: Effort normal and breath sounds normal. No respiratory distress. She has no wheezes. She has no rales. She exhibits no tenderness.  GI: Soft. Bowel sounds are normal. She exhibits no distension and no mass. There is no tenderness. There is no rebound and no guarding.  Genitourinary:       Vulva is normal without lesions Vagina is pink moist without discharge Cervix normal in appearance and pap is not done Uterus is normal size shape and contour, with a couple of small 3 cm or less fibroids., pt is s/p myomectomy  x 1. Adnexa is negative with normal sized ovaries by sonogram  Musculoskeletal: Normal range of motion. She exhibits no edema and no tenderness.  Neurological: She is alert and oriented to person, place, and time. She has normal reflexes. She displays normal reflexes. No cranial nerve deficit.  She exhibits normal muscle tone. Coordination normal.  Skin: Skin is warm and dry. No rash noted. No erythema. No pallor.  Psychiatric: She has a normal mood and affect. Her behavior is normal. Judgment and thought content normal.  Labs are done thru [facility].     Assessment:    Healthy female exam.  normal pap in 2014 contr management via nuvaring.   Plan:    Contraception: NuvaRing vaginal inserts.

## 2013-10-28 ENCOUNTER — Other Ambulatory Visit: Payer: Self-pay | Admitting: Obstetrics and Gynecology

## 2013-10-28 ENCOUNTER — Telehealth: Payer: Self-pay | Admitting: *Deleted

## 2013-10-28 DIAGNOSIS — N390 Urinary tract infection, site not specified: Secondary | ICD-10-CM

## 2013-10-28 MED ORDER — CEPHALEXIN 500 MG PO CAPS: 500.0000 mg | ORAL_CAPSULE | Freq: Four times a day (QID) | ORAL | Status: DC

## 2013-10-28 NOTE — Telephone Encounter (Signed)
Pt states that she needs something for UTI symptoms. I spoke with Dr. Glo Herring about this and he advised that he would call in an Rx for the UTI. Pt was informed of this and advised to check back with her pharmacy later today. Pt verbalized understanding.

## 2013-12-13 ENCOUNTER — Encounter: Payer: Self-pay | Admitting: Obstetrics and Gynecology

## 2014-02-15 ENCOUNTER — Emergency Department (HOSPITAL_COMMUNITY)
Admission: EM | Admit: 2014-02-15 | Discharge: 2014-02-15 | Disposition: A | Discharge status: Home / Self Care | Payer: BLUE CROSS/BLUE SHIELD | Source: Home / Self Care | Attending: Family Medicine | Admitting: Family Medicine

## 2014-02-15 ENCOUNTER — Encounter (HOSPITAL_COMMUNITY): Payer: Self-pay | Admitting: Emergency Medicine

## 2014-02-15 DIAGNOSIS — B9789 Other viral agents as the cause of diseases classified elsewhere: Principal | ICD-10-CM

## 2014-02-15 DIAGNOSIS — J069 Acute upper respiratory infection, unspecified: Secondary | ICD-10-CM

## 2014-02-15 LAB — POCT RAPID STREP A: Streptococcus, Group A Screen (Direct): NEGATIVE

## 2014-02-15 MED ORDER — GUAIFENESIN-CODEINE 100-10 MG/5ML PO SOLN: 5.0000 mL | Freq: Four times a day (QID) | ORAL | Status: DC | PRN

## 2014-02-15 MED ORDER — BENZOCAINE (TOPICAL) 20 % EX AERO: 1.0000 "application " | INHALATION_SPRAY | Freq: Four times a day (QID) | CUTANEOUS | Status: DC | PRN

## 2014-02-15 MED ORDER — FLUTICASONE PROPIONATE 50 MCG/ACT NA SUSP: 2.0000 | Freq: Every day | NASAL | Status: DC

## 2014-02-15 MED ORDER — IPRATROPIUM BROMIDE 0.06 % NA SOLN: 2.0000 | Freq: Four times a day (QID) | NASAL | Status: DC

## 2014-02-15 NOTE — Discharge Instructions (Signed)
Your upper respiratory symptoms are likely due to a viral illness that will take another few days to resolve Please use the nasal atrovent during the day and flonase at night. Please also consider using ibuprofen 600mg  every 6 hour for pain and swelling Please stay well hydrated and get lots of rest.  Upper Respiratory Infection, Adult An upper respiratory infection (URI) is also sometimes known as the common cold. The upper respiratory tract includes the nose, sinuses, throat, trachea, and bronchi. Bronchi are the airways leading to the lungs. Most people improve within 1 week, but symptoms can last up to 2 weeks. A residual cough may last even longer.  CAUSES Many different viruses can infect the tissues lining the upper respiratory tract. The tissues become irritated and inflamed and often become very moist. Mucus production is also common. A cold is contagious. You can easily spread the virus to others by oral contact. This includes kissing, sharing a glass, coughing, or sneezing. Touching your mouth or nose and then touching a surface, which is then touched by another person, can also spread the virus. SYMPTOMS  Symptoms typically develop 1 to 3 days after you come in contact with a cold virus. Symptoms vary from person to person. They may include:  Runny nose.  Sneezing.  Nasal congestion.  Sinus irritation.  Sore throat.  Loss of voice (laryngitis).  Cough.  Fatigue.  Muscle aches.  Loss of appetite.  Headache.  Low-grade fever. DIAGNOSIS  You might diagnose your own cold based on familiar symptoms, since most people get a cold 2 to 3 times a year. Your caregiver can confirm this based on your exam. Most importantly, your caregiver can check that your symptoms are not due to another disease such as strep throat, sinusitis, pneumonia, asthma, or epiglottitis. Blood tests, throat tests, and X-rays are not necessary to diagnose a common cold, but they may sometimes be helpful  in excluding other more serious diseases. Your caregiver will decide if any further tests are required. RISKS AND COMPLICATIONS  You may be at risk for a more severe case of the common cold if you smoke cigarettes, have chronic heart disease (such as heart failure) or lung disease (such as asthma), or if you have a weakened immune system. The very young and very old are also at risk for more serious infections. Bacterial sinusitis, middle ear infections, and bacterial pneumonia can complicate the common cold. The common cold can worsen asthma and chronic obstructive pulmonary disease (COPD). Sometimes, these complications can require emergency medical care and may be life-threatening. PREVENTION  The best way to protect against getting a cold is to practice good hygiene. Avoid oral or hand contact with people with cold symptoms. Wash your hands often if contact occurs. There is no clear evidence that vitamin C, vitamin E, echinacea, or exercise reduces the chance of developing a cold. However, it is always recommended to get plenty of rest and practice good nutrition. TREATMENT  Treatment is directed at relieving symptoms. There is no cure. Antibiotics are not effective, because the infection is caused by a virus, not by bacteria. Treatment may include:  Increased fluid intake. Sports drinks offer valuable electrolytes, sugars, and fluids.  Breathing heated mist or steam (vaporizer or shower).  Eating chicken soup or other clear broths, and maintaining good nutrition.  Getting plenty of rest.  Using gargles or lozenges for comfort.  Controlling fevers with ibuprofen or acetaminophen as directed by your caregiver.  Increasing usage of your inhaler if  you have asthma. Zinc gel and zinc lozenges, taken in the first 24 hours of the common cold, can shorten the duration and lessen the severity of symptoms. Pain medicines may help with fever, muscle aches, and throat pain. A variety of  non-prescription medicines are available to treat congestion and runny nose. Your caregiver can make recommendations and may suggest nasal or lung inhalers for other symptoms.  HOME CARE INSTRUCTIONS   Only take over-the-counter or prescription medicines for pain, discomfort, or fever as directed by your caregiver.  Use a warm mist humidifier or inhale steam from a shower to increase air moisture. This may keep secretions moist and make it easier to breathe.  Drink enough water and fluids to keep your urine clear or pale yellow.  Rest as needed.  Return to work when your temperature has returned to normal or as your caregiver advises. You may need to stay home longer to avoid infecting others. You can also use a face mask and careful hand washing to prevent spread of the virus. SEEK MEDICAL CARE IF:   After the first few days, you feel you are getting worse rather than better.  You need your caregiver's advice about medicines to control symptoms.  You develop chills, worsening shortness of breath, or brown or red sputum. These may be signs of pneumonia.  You develop yellow or brown nasal discharge or pain in the face, especially when you bend forward. These may be signs of sinusitis.  You develop a fever, swollen neck glands, pain with swallowing, or white areas in the back of your throat. These may be signs of strep throat. SEEK IMMEDIATE MEDICAL CARE IF:   You have a fever.  You develop severe or persistent headache, ear pain, sinus pain, or chest pain.  You develop wheezing, a prolonged cough, cough up blood, or have a change in your usual mucus (if you have chronic lung disease).  You develop sore muscles or a stiff neck. Document Released: 07/24/2000 Document Revised: 04/22/2011 Document Reviewed: 05/05/2013 [facility] Patient Information 2015 [place], [place]. This information is not intended to replace advice given to you by your health care provider. Make sure you discuss any  questions you have with your health care provider.

## 2014-02-15 NOTE — ED Notes (Signed)
C/o  Non productive cough.  Sore throat.  Loss of voice.   Gradually getting worse.  Denies fever, n/v/d  No relief with otc meds.  On set 02/11/14.

## 2014-02-15 NOTE — ED Provider Notes (Signed)
CSN: [id number]     Arrival date & time 02/15/14  1150 History   First MD Initiated Contact with Patient 02/15/14 1207     Chief Complaint  Patient presents with  . Cough   (Consider location/radiation/quality/duration/timing/severity/associated sxs/prior Treatment) HPI  Sore throat. Started on New years day. Subsequently developed L ear pain and cough and rhinorrhea. OTC cough and cold medications w/o benefit. Now has lost voice. Associated w/ occasional HA. Denies CP, SOB, palpitations. Husband w/ similar symptoms.    Past Medical History  Diagnosis Date  . Thyroid disease   . Fibroids    Past Surgical History  Procedure Laterality Date  . Uterine fibroid surgery     Family History  Problem Relation Age of Onset  . Cancer Mother 92    breast  . Cancer Maternal Grandmother     breast   History  Substance Use Topics  . Smoking status: Never Smoker   . Smokeless tobacco: Never Used  . Alcohol Use: No   OB History    Gravida Para Term Preterm AB TAB SAB Ectopic Multiple Living   1 1        1      Review of Systems Per HPI with all other pertinent systems negative.   Allergies  Nitrofuran derivatives and Sulfa antibiotics  Home Medications   Prior to Admission medications   Medication Sig Start Date End Date Taking? Authorizing Provider  levothyroxine (SYNTHROID, LEVOTHROID) 125 MCG tablet Take 125 mcg by mouth daily before breakfast.   Yes Historical Provider, MD  NUVARING 0.12-0.015 MG/24HR vaginal ring INSERT 1 RING VAGINALLY AND LEAVE IN PLACE FOR 3 CONSECUTIVE WEEKS* THEN REMOVE FOR 1 WEEK 10/08/13  Yes Jonnie Kind, MD  benzocaine (HURRICAINE) 20 % oral spray Use as directed 1 application in the mouth or throat 4 (four) times daily as needed for throat irritation / pain. 02/15/14   Waldemar Dickens, MD  cephALEXin (KEFLEX) 500 MG capsule Take 1 capsule (500 mg total) by mouth 4 (four) times daily. 10/28/13   Jonnie Kind, MD  fluticasone (FLONASE) 50 MCG/ACT  nasal spray Place 2 sprays into both nostrils at bedtime. 02/15/14   Waldemar Dickens, MD  ipratropium (ATROVENT) 0.06 % nasal spray Place 2 sprays into both nostrils 4 (four) times daily. 02/15/14   Waldemar Dickens, MD   BP 130/91 mmHg  Pulse 70  Temp(Src) 98.3 F (36.8 C) (Oral)  Resp 18  SpO2 100%  LMP 01/25/2014 Physical Exam  Constitutional: She is oriented to person, place, and time. She appears well-developed and well-nourished. No distress.  HENT:  Pharyngeal injection and cobblestoning Tonsils 1+ w/ minimal exudate No maxillary or frontal sinus ttp Hoarse voice  Eyes: EOM are normal. Pupils are equal, round, and reactive to light.  Neck: Normal range of motion.  Cardiovascular: Normal rate, normal heart sounds and intact distal pulses.   No murmur heard. Pulmonary/Chest: Effort normal and breath sounds normal.  Abdominal: Soft. She exhibits no distension. There is no tenderness.  Musculoskeletal: Normal range of motion. She exhibits no tenderness.  Neurological: She is alert and oriented to person, place, and time.  Skin: Skin is warm. She is not diaphoretic.  Psychiatric: She has a normal mood and affect. Her behavior is normal. Judgment and thought content normal.    ED Course  Procedures (including critical care time) Labs Review Labs Reviewed  POCT RAPID STREP A ([place])    Imaging Review No results found.  MDM   1. Viral URI with cough    Nasal atrovent Flonase NSAIDs, fluids and rest Robitussin AC No need for ABX at this time.  Precautions given and all questions answered  Linna Darner, MD Family Medicine 02/15/2014, 12:41 PM      Waldemar Dickens, MD 02/15/14 1248

## 2014-02-17 LAB — CULTURE, GROUP A STREP

## 2014-02-18 NOTE — ED Notes (Signed)
Patient called, c/o unable to rest and was up coughing all night long. Requesting something stronger for her cough. After review of record, per Dr Barbaraann Faster instructions, patient has been advised she should try to wait it out a couple of more days, ad he has already Rx all he can for a viral cough. Patient was also advised some patient find they cannot sleep when they use the hydrocodone cough syrup , and at add a humidifier to her treatment , also w frequent small sips of cold fluids

## 2014-08-04 ENCOUNTER — Other Ambulatory Visit (HOSPITAL_COMMUNITY): Payer: Self-pay | Admitting: Family Medicine

## 2014-08-04 DIAGNOSIS — Z1231 Encounter for screening mammogram for malignant neoplasm of breast: Secondary | ICD-10-CM

## 2014-08-29 ENCOUNTER — Ambulatory Visit (HOSPITAL_COMMUNITY)
Admission: RE | Admit: 2014-08-29 | Discharge: 2014-08-29 | Disposition: A | Discharge status: Home / Self Care | Payer: BLUE CROSS/BLUE SHIELD | Source: Ambulatory Visit | Attending: Family Medicine | Admitting: Family Medicine

## 2014-08-29 DIAGNOSIS — Z1231 Encounter for screening mammogram for malignant neoplasm of breast: Secondary | ICD-10-CM | POA: Insufficient documentation

## 2014-08-31 ENCOUNTER — Other Ambulatory Visit: Payer: Self-pay | Admitting: Obstetrics and Gynecology

## 2014-08-31 NOTE — Telephone Encounter (Signed)
refil nuvaring x 1 yr

## 2014-10-25 ENCOUNTER — Ambulatory Visit (INDEPENDENT_AMBULATORY_CARE_PROVIDER_SITE_OTHER): Payer: BLUE CROSS/BLUE SHIELD | Admitting: Obstetrics and Gynecology

## 2014-10-25 ENCOUNTER — Encounter: Payer: Self-pay | Admitting: Obstetrics and Gynecology

## 2014-10-25 VITALS — BP 110/74 | Ht 67.0 in | Wt 180.0 lb

## 2014-10-25 DIAGNOSIS — Z1389 Encounter for screening for other disorder: Secondary | ICD-10-CM

## 2014-10-25 DIAGNOSIS — K5904 Chronic idiopathic constipation: Secondary | ICD-10-CM | POA: Insufficient documentation

## 2014-10-25 DIAGNOSIS — Z01419 Encounter for gynecological examination (general) (routine) without abnormal findings: Secondary | ICD-10-CM | POA: Diagnosis not present

## 2014-10-25 DIAGNOSIS — R350 Frequency of micturition: Secondary | ICD-10-CM | POA: Insufficient documentation

## 2014-10-25 LAB — POCT URINALYSIS DIPSTICK
Blood, UA: 1
GLUCOSE UA: NEGATIVE
Ketones, UA: NEGATIVE
Leukocytes, UA: NEGATIVE
NITRITE UA: NEGATIVE
Protein, UA: NEGATIVE

## 2014-10-25 MED ORDER — NITROFURANTOIN MONOHYD MACRO 100 MG PO CAPS: 100.0000 mg | ORAL_CAPSULE | Freq: Two times a day (BID) | ORAL | Status: DC

## 2014-10-25 MED ORDER — LUBIPROSTONE 24 MCG PO CAPS: 24.0000 ug | ORAL_CAPSULE | Freq: Two times a day (BID) | ORAL | Status: DC

## 2014-10-25 NOTE — Progress Notes (Signed)
Patient ID: Tiffany Underwood, female   DOB: [birth year], 44 y.o.   MRN: [id number] Pt here today for annual exam. Pt states that she is having UTI symptoms. Pt states that she is having urinary frequency and pressure after urination.  Subjective:     Tiffany Underwood is a 44 y.o. female here for a routine exam.  Current complaints: chronic constipation.  Personal health questionnaire reviewed: no.  / Gynecologic History Patient's last menstrual period was 10/04/2014. Contraception:  Last Pap: 2014. Results were: normal Last mammogram: normal  7./2016 Results were: normal  Obstetric History OB History  Gravida Para Term Preterm AB SAB TAB Ectopic Multiple Living  1 1        1     # Outcome Date GA Lbr Len/2nd Weight Sex Delivery Anes PTL Lv  1 Para 1996    M Vag-Spont   Y       Neg hx   Review of Systems  Review of Systems  Constitutional: Negative for fever, chills, weight loss, malaise/fatigue and diaphoresis.  HENT: Negative for hearing loss, ear pain, nosebleeds, congestion, sore throat, neck pain, tinnitus and ear discharge.   Eyes: Negative for blurred vision, double vision, photophobia, pain, discharge and redness.  Respiratory: Negative for cough, hemoptysis, sputum production, shortness of breath, wheezing and stridor.   Cardiovascular: Negative for chest pain, palpitations, orthopnea, claudication, leg swelling and PND.  Gastrointestinal: negative for abdominal pain with constipation  bm 2 wk or less frequency . Negative for heartburn, nausea, vomiting, diarrhea, constipation, blood in stool and melena.  Genitourinary: Negative for dysuria, urgency, frequency, hematuria and flank pain.  Musculoskeletal: Negative for myalgias, back pain, joint pain and falls.  Skin: Negative for itching and rash.  Neurological: Negative for dizziness, tingling, tremors, sensory change, speech change, focal weakness, seizures, loss of consciousness, weakness and headaches.  Endo/Heme/Allergies:  Negative for environmental allergies and polydipsia. Does not bruise/bleed easily.  Psychiatric/Behavioral: Negative for depression, suicidal ideas, hallucinations, memory loss and substance abuse. The patient is not nervous/anxious and does not have insomnia.        Objective:    Physical Exam  Vitals reviewed. Constitutional: She is oriented to person, place, and time. She appears well-developed and well-nourished.  HENT:  Head: Normocephalic and atraumatic.        Right Ear: External ear normal.  Left Ear: External ear normal.  Nose: Nose normal.  Mouth/Throat: Oropharynx is clear and moist.  Eyes: Conjunctivae and EOM are normal. Pupils are equal, round, and reactive to light. Right eye exhibits no discharge. Left eye exhibits no discharge. No scleral icterus.  Neck: Normal range of motion. Neck supple. No tracheal deviation present. No thyromegaly present.  Cardiovascular: Normal rate, regular rhythm, normal heart sounds and intact distal pulses.  Exam reveals no gallop and no friction rub.   No murmur heard. Respiratory: Effort normal and breath sounds normal. No respiratory distress. She has no wheezes. She has no rales. She exhibits no tenderness.  GI: Soft. Bowel sounds are normal. She exhibits no distension and no mass. There is no tenderness. There is no rebound and no guarding.  Genitourinary:       Vulva is normal without lesions Vagina is pink moist without discharge Cervix normal in appearance and pap is not done Uterus is normal size shape and contour with a small 4 cm fibroid , known, on anterior right side Adnexa is negative with normal sized ovarie Musculoskeletal: Normal range of motion. She exhibits no edema and  no tenderness.  Neurological: She is alert and oriented to person, place, and time. She has normal reflexes. She displays normal reflexes. No cranial nerve deficit. She exhibits normal muscle tone. Coordination normal.  Skin: Skin is warm and dry. No rash  noted. No erythema. No pallor.  Psychiatric: She has a normal mood and affect. Her behavior is normal. Judgment and thought content normal.       Assessment:    Healthy female exam.   Chronic constipation Stable ut fibroid uti sx.   Plan:    Follow up in: 1 year.   Rx amitiza 24 bid rx macrobid

## 2014-10-26 LAB — MICROSCOPIC EXAMINATION: CASTS: NONE SEEN /LPF

## 2014-10-26 LAB — URINALYSIS, ROUTINE W REFLEX MICROSCOPIC
BILIRUBIN UA: NEGATIVE
Glucose, UA: NEGATIVE
KETONES UA: NEGATIVE
NITRITE UA: NEGATIVE
PH UA: 6 (ref 5.0–7.5)
Protein, UA: NEGATIVE
SPEC GRAV UA: 1.011 (ref 1.005–1.030)
Urobilinogen, Ur: 0.2 mg/dL (ref 0.2–1.0)

## 2014-10-27 LAB — URINE CULTURE

## 2014-11-07 ENCOUNTER — Other Ambulatory Visit: Payer: Self-pay | Admitting: Obstetrics and Gynecology

## 2014-11-16 ENCOUNTER — Encounter: Payer: Self-pay | Admitting: Allergy and Immunology

## 2014-11-16 ENCOUNTER — Ambulatory Visit (INDEPENDENT_AMBULATORY_CARE_PROVIDER_SITE_OTHER): Payer: BLUE CROSS/BLUE SHIELD | Admitting: Allergy and Immunology

## 2014-11-16 VITALS — BP 140/86 | HR 80 | Temp 97.7°F | Resp 16 | Ht 66.14 in | Wt 181.2 lb

## 2014-11-16 DIAGNOSIS — R1084 Generalized abdominal pain: Secondary | ICD-10-CM

## 2014-11-16 DIAGNOSIS — L501 Idiopathic urticaria: Secondary | ICD-10-CM

## 2014-11-16 DIAGNOSIS — T7800XA Anaphylactic reaction due to unspecified food, initial encounter: Secondary | ICD-10-CM | POA: Diagnosis not present

## 2014-11-16 DIAGNOSIS — T783XXA Angioneurotic edema, initial encounter: Secondary | ICD-10-CM | POA: Insufficient documentation

## 2014-11-16 DIAGNOSIS — J3089 Other allergic rhinitis: Secondary | ICD-10-CM | POA: Insufficient documentation

## 2014-11-16 DIAGNOSIS — T782XXA Anaphylactic shock, unspecified, initial encounter: Secondary | ICD-10-CM | POA: Diagnosis not present

## 2014-11-16 DIAGNOSIS — J310 Chronic rhinitis: Secondary | ICD-10-CM | POA: Diagnosis not present

## 2014-11-16 MED ORDER — FLUTICASONE PROPIONATE 50 MCG/ACT NA SUSP: 1.0000 | Freq: Every day | NASAL | Status: DC

## 2014-11-16 NOTE — Assessment & Plan Note (Signed)
Chronic urticaria and angioedema. There is no obvious etiology identified. Skin tests to select food allergens were negative today. NSAIDs and emotional stress commonly exacerbate urticaria but are not the underlying etiology in this case. Physical urticarias are negative by history (i.e. pressure-induced, temperature, vibration, solar, etc.). History and lesions are not consistent with urticaria pigmentosa so I am not suspicious for mastocytosis. There are no concomitant symptoms concerning for anaphylaxis or constitutional symptoms worrisome for an underlying malignancy. We will rule out other potential etiologies with labs. For symptom relief, patient is to take oral antihistamines as directed.  The following labs have been ordered: FCeRI antibody, TSH, anti-thyroglobulin antibody, thyroid peroxidase antibody, tryptase, urea breath test, C4, ESR, ANA, and galactose-alpha-1,3-galactose IgE level.  The patient will be called with further recommendations after lab results have returned.  Instructions have been provided and discussed for H1/H2 receptor blockade with titration to find lowest effective dose.  A journal is to be kept recording any foods eaten, beverages consumed, medications taken within a 6 hour period prior to the onset of symptoms, as well as record activities being performed, and environmental conditions. For any symptoms concerning for anaphylaxis, 911 is to be called immediately.

## 2014-11-16 NOTE — Progress Notes (Signed)
History of present illness: HPI Comments: Over the past 6 years, Tiffany Underwood has experienced recurrent episodes of hives as well as episodes of lip swelling. Typical distribution includes the legs, hips, and buttocks.  The hives are described as red, raised and itchy.  Individual hives resolve completely without residual pigmentation or bruising. The patient does not experience concomitant cardiopulmonary or GI symptoms.  She has not experienced unexpected weight loss, recurrent fevers or drenching night sweats. No specific medication, food or environmental triggers have been identified. The symptoms do not seem to correlate with NSAIDs use. Symptoms do not seem to correlate with emotional stress. The patient did not have signs or symptoms of infection at the time of symptom onset. Tiffany Underwood has tried to control symptoms with the following medications: over-the-counter antihistamines and oral steroids. These medications have offered good temporary relief of symptoms. The patient has required Emergency Room evaluation and treatment for these symptoms. Skin biopsy has not been performed.  She has no family history of urticaria or angioedema.  She has taken fluticasone nasal spray in the past for rhinitis.     Assessment and plan: Idiopathic urticaria Chronic urticaria and angioedema. There is no obvious etiology identified. Skin tests to select food allergens were negative today. NSAIDs and emotional stress commonly exacerbate urticaria but are not the underlying etiology in this case. Physical urticarias are negative by history (i.e. pressure-induced, temperature, vibration, solar, etc.). History and lesions are not consistent with urticaria pigmentosa so I am not suspicious for mastocytosis. There are no concomitant symptoms concerning for anaphylaxis or constitutional symptoms worrisome for an underlying malignancy. We will rule out other potential etiologies with labs. For symptom relief, patient is to take oral  antihistamines as directed.  The following labs have been ordered: FCeRI antibody, TSH, anti-thyroglobulin antibody, thyroid peroxidase antibody, tryptase, urea breath test, C4, ESR, ANA, and galactose-alpha-1,3-galactose IgE level.  The patient will be called with further recommendations after lab results have returned.  Instructions have been provided and discussed for H1/H2 receptor blockade with titration to find lowest effective dose.  A journal is to be kept recording any foods eaten, beverages consumed, medications taken within a 6 hour period prior to the onset of symptoms, as well as record activities being performed, and environmental conditions. For any symptoms concerning for anaphylaxis, 911 is to be called immediately.  Other allergic rhinitis  Aeroallergen avoidance measures have been discussed and provided in written form.  A prescription has been provided for fluticasone nasal spray, one spray per nostril 1-2 times daily as needed. Proper nasal spray technique has been discussed and demonstrated.  Cetirizine 10 mg daily as needed.   Diagnositics: Food allergen skin tests were negative despite a positive histamine control.  Environmental skin tests were positive to dust mite antigen.    Physical examination: Blood pressure 140/86, pulse 80, temperature 97.7 F (36.5 C), resp. rate 16, height 5' 6.14" (1.68 m), weight 181 lb 3.5 oz (82.2 kg), last menstrual period 10/04/2014.  General: Alert, interactive, in no acute distress. HEENT: TMs pearly gray, turbinates moderately edematous, post-pharynx minimally erythematous. Neck: Supple without lymphadenopathy. Lungs: Clear to auscultation without wheezing, rhonchi or rales. CV: Normal S1, S2 without murmurs. Skin: Warm and dry, without lesions or rashes. Extremities:  No clubbing, cyanosis or edema. Neuro:   Grossly intact.  Review of systems: Review of Systems  Constitutional: Negative for fever, chills and weight  loss.  HENT: Negative for nosebleeds.   Eyes: Negative for blurred vision.  Respiratory: Negative for  hemoptysis.   Cardiovascular: Negative for chest pain.  Gastrointestinal: Negative for diarrhea and constipation.  Genitourinary: Negative for dysuria.  Musculoskeletal: Negative for myalgias and joint pain.  Neurological: Negative for dizziness.  Endo/Heme/Allergies: Does not bruise/bleed easily.    Past medical history: Past Medical History  Diagnosis Date  . Thyroid disease   . Fibroids   . Angio-edema     Primary sent here to determine  . Urticaria     Past surgical history: Past Surgical History  Procedure Laterality Date  . Uterine fibroid surgery      Family history: Family History  Problem Relation Age of Onset  . Cancer Mother 28    breast  . Cancer Maternal Grandmother     breast    Social history: Social History   Social History  . Marital Status: Married    Spouse Name: N/A  . Number of Children: N/A  . Years of Education: N/A   Occupational History  . Not on file.   Social History Main Topics  . Smoking status: Never Smoker   . Smokeless tobacco: Never Used  . Alcohol Use: No  . Drug Use: No  . Sexual Activity: Yes    Birth Control/ Protection: Inserts   Other Topics Concern  . Not on file   Social History Narrative   Environmental History:  Tiffany Underwood lives in a 43 year old house with carpeting throughout and central air/heat.  There is a dog in house which does not have access to her bedroom.  Known medication allergies: Allergies  Allergen Reactions  . Nitrofuran Derivatives Rash  . Sulfa Antibiotics Rash    Outpatient medications:   Medication List       This list is accurate as of: 11/16/14  4:02 PM.  Always use your most recent med list.               benzocaine 20 % oral spray  Commonly known as:  HURRICAINE  Use as directed 1 application in the mouth or throat 4 (four) times daily as needed for throat irritation /  pain.     cephALEXin 500 MG capsule  Commonly known as:  KEFLEX  Take 1 capsule (500 mg total) by mouth 4 (four) times daily.     fluticasone 50 MCG/ACT nasal spray  Commonly known as:  FLONASE  Place 2 sprays into both nostrils at bedtime.     guaiFENesin-codeine 100-10 MG/5ML syrup  Take 5-10 mLs by mouth every 6 (six) hours as needed for cough.     ipratropium 0.06 % nasal spray  Commonly known as:  ATROVENT  Place 2 sprays into both nostrils 4 (four) times daily.     levothyroxine 125 MCG tablet  Commonly known as:  SYNTHROID, LEVOTHROID  Take 125 mcg by mouth daily before breakfast.     lubiprostone 24 MCG capsule  Commonly known as:  AMITIZA  Take 1 capsule (24 mcg total) by mouth 2 (two) times daily with a meal.     nitrofurantoin (macrocrystal-monohydrate) 100 MG capsule  Commonly known as:  MACROBID  Take 1 capsule (100 mg total) by mouth 2 (two) times daily. For uti     NUVARING 0.12-0.015 MG/24HR vaginal ring  Generic drug:  etonogestrel-ethinyl estradiol  INSERT 1 RING VAGINALLY AND LEAVE FOR 3 CONSECUTIVE WEEKS, THEN REMOVE FOR 1 WEEK     predniSONE 5 MG tablet  Commonly known as:  DELTASONE  Take 5 mg by mouth daily with breakfast.  I appreciate the opportunity to take part in this Tiffany Underwood's care. Please do not hesitate to contact me with questions.  Sincerely,   R. Edgar Frisk, MD

## 2014-11-16 NOTE — Patient Instructions (Signed)
Idiopathic urticaria Chronic urticaria and angioedema. There is no obvious etiology identified. Skin tests to select food allergens were negative today. NSAIDs and emotional stress commonly exacerbate urticaria but are not the underlying etiology in this case. Physical urticarias are negative by history (i.e. pressure-induced, temperature, vibration, solar, etc.). History and lesions are not consistent with urticaria pigmentosa so I am not suspicious for mastocytosis. There are no concomitant symptoms concerning for anaphylaxis or constitutional symptoms worrisome for an underlying malignancy. We will rule out other potential etiologies with labs. For symptom relief, patient is to take oral antihistamines as directed.  The following labs have been ordered: FCeRI antibody, TSH, anti-thyroglobulin antibody, thyroid peroxidase antibody, tryptase, urea breath test, C4, ESR, ANA, and galactose-alpha-1,3-galactose IgE level.  The patient will be called with further recommendations after lab results have returned.  Instructions have been provided and discussed for H1/H2 receptor blockade with titration to find lowest effective dose.  A journal is to be kept recording any foods eaten, beverages consumed, medications taken within a 6 hour period prior to the onset of symptoms, as well as record activities being performed, and environmental conditions. For any symptoms concerning for anaphylaxis, 911 is to be called immediately.  Other allergic rhinitis  Aeroallergen avoidance measures have been discussed and provided in written form.  A prescription has been provided for fluticasone nasal spray, one spray per nostril 1-2 times daily as needed. Proper nasal spray technique has been discussed and demonstrated.  Cetirizine 10 mg daily as needed.   Return in about 4 weeks (around 12/14/2014), or if symptoms worsen or fail to improve.  Urticaria (Hives)  . Cetirizine (Zyrtec) 83m once a day.  If symptoms  continue then increase to .  .Marland KitchenCetirizine (Zyrtec) 115m twice a day.  If symptoms continue then increase to .  . Marland Kitchenetirizine (Zyrtec) 1051mtwice a day and Ranitidine (Zantac) 150 mg once a day.  If symptoms continue then increase to.  . Cetirizine (Zyrtec) 77m3mwice a day and Ranitidine (Zantac) 150 mg twice a day  May use Benadryl as needed for breakthrough symptoms       If no symptoms for 7 days, then step down dosage

## 2014-11-16 NOTE — Assessment & Plan Note (Signed)
   Aeroallergen avoidance measures have been discussed and provided in written form.  A prescription has been provided for fluticasone nasal spray, one spray per nostril 1-2 times daily as needed. Proper nasal spray technique has been discussed and demonstrated.  Cetirizine 10 mg daily as needed.

## 2014-12-20 ENCOUNTER — Ambulatory Visit (INDEPENDENT_AMBULATORY_CARE_PROVIDER_SITE_OTHER): Payer: BLUE CROSS/BLUE SHIELD | Admitting: Allergy and Immunology

## 2014-12-20 ENCOUNTER — Encounter: Payer: Self-pay | Admitting: Allergy and Immunology

## 2014-12-20 VITALS — BP 142/92 | HR 72 | Resp 20

## 2014-12-20 DIAGNOSIS — L501 Idiopathic urticaria: Secondary | ICD-10-CM | POA: Diagnosis not present

## 2014-12-20 DIAGNOSIS — T783XXD Angioneurotic edema, subsequent encounter: Secondary | ICD-10-CM | POA: Diagnosis not present

## 2014-12-20 DIAGNOSIS — J3089 Other allergic rhinitis: Secondary | ICD-10-CM

## 2014-12-20 DIAGNOSIS — R030 Elevated blood-pressure reading, without diagnosis of hypertension: Secondary | ICD-10-CM | POA: Diagnosis not present

## 2014-12-20 NOTE — Assessment & Plan Note (Addendum)
All laboratory results were negative/within normal limits with the exception of borderline positive/equivocal results to beef and mutton. However, Adamarie's symptoms do not correlate with the consumption of these foods, therefore they likely represent false positive results.  Continue H1/H2 receptor blockade, titrating to the lowest effective dose necessary to suppress urticaria.  Should severe/significant symptoms occur, a journal is to be kept recording any foods eaten, beverages consumed, and medications taken within a 6 hour time period prior to the onset of symptoms, as well as record activities being performed, and environmental conditions. For any symptoms concerning for anaphylaxis, 911 is to be called immediately.

## 2014-12-20 NOTE — Patient Instructions (Signed)
Idiopathic urticaria All laboratory results were negative/within normal limits with the exception of borderline positive/equivocal results to beef and mutton. However, Tiffany Underwood's symptoms do not correlate with the consumption of these foods, therefore they likely represent false positive results.  Continue H1/H2 receptor blockade, titrating to the lowest effective dose necessary to suppress urticaria.  Should severe/significant symptoms occur, a journal is to be kept recording any foods eaten, beverages consumed, and medications taken within a 6 hour time period prior to the onset of symptoms, as well as record activities being performed, and environmental conditions. For any symptoms concerning for anaphylaxis, 911 is to be called immediately.  Allergic rhinitis  Continue allergen avoidance measures as well as fluticasone nasal spray and cetirizine as needed.  Elevated blood-pressure reading without diagnosis of hypertension  Tiffany Underwood has been asked to follow up with her primary care physician regarding this issue.  She has verbalized understanding and has agreed to do so.    Return in about 6 months (around 06/19/2015), or if symptoms worsen or fail to improve.

## 2014-12-20 NOTE — Progress Notes (Signed)
History of present illness: HPI Comments: Tiffany Underwood is a 44 y.o. female with chronic urticaria and allergic rhinitis who presents today for follow up.  She reports that cetirizine 10 mg daily has suppressed urticaria and angioedema, however she still occasionally experiences a tingling sensation in her right facial cheek which she finds difficult to characterize other than, "It feels like it is trying to swell."  She is unable to identify any specific medication, food, or environmental triggers.  She has no nasal symptom complaints today.   Assessment and plan: Idiopathic urticaria All laboratory results were negative/within normal limits with the exception of borderline positive/equivocal results to beef and mutton. However, Tiffany Underwood's symptoms do not correlate with the consumption of these foods, therefore they likely represent false positive results.  Continue H1/H2 receptor blockade, titrating to the lowest effective dose necessary to suppress urticaria.  Should severe/significant symptoms occur, a journal is to be kept recording any foods eaten, beverages consumed, and medications taken within a 6 hour time period prior to the onset of symptoms, as well as record activities being performed, and environmental conditions. For any symptoms concerning for anaphylaxis, 911 is to be called immediately.  Allergic rhinitis  Continue allergen avoidance measures as well as fluticasone nasal spray and cetirizine as needed.  Elevated blood-pressure reading without diagnosis of hypertension  [name] has been asked to follow up with her primary care physician regarding this issue.  She has verbalized understanding and has agreed to do so.       Physical examination: Blood pressure 142/92 (15 minutes later: 140/90), pulse 72, resp. rate 20.  General: Alert, interactive, in no acute distress. HEENT: TMs pearly gray, turbinates mildly edematous without discharge, post-pharynx  unremarkable. Neck: Supple without lymphadenopathy. Lungs: Clear to auscultation without wheezing, rhonchi or rales. CV: Normal S1, S2 without murmurs. Skin: Warm and dry, without lesions or rashes.  The following portions of the patient's history were reviewed and updated as appropriate: allergies, current medications, past family history, past medical history, past social history, past surgical history and problem list.  Outpatient medications:   Medication List       This list is accurate as of: 12/20/14  6:11 PM.  Always use your most recent med list.               benzocaine 20 % oral spray  Commonly known as:  HURRICAINE  Use as directed 1 application in the mouth or throat 4 (four) times daily as needed for throat irritation / pain.     cephALEXin 500 MG capsule  Commonly known as:  KEFLEX  Take 1 capsule (500 mg total) by mouth 4 (four) times daily.     cetirizine 10 MG tablet  Commonly known as:  ZYRTEC  Take 10 mg by mouth daily.     fluticasone 50 MCG/ACT nasal spray  Commonly known as:  FLONASE  Place 1 spray into both nostrils daily. Use 1-2 times daily     guaiFENesin-codeine 100-10 MG/5ML syrup  Take 5-10 mLs by mouth every 6 (six) hours as needed for cough.     ipratropium 0.06 % nasal spray  Commonly known as:  ATROVENT  Place 2 sprays into both nostrils 4 (four) times daily.     levothyroxine 125 MCG tablet  Commonly known as:  SYNTHROID, LEVOTHROID  Take 125 mcg by mouth daily before breakfast.     lubiprostone 24 MCG capsule  Commonly known as:  AMITIZA  Take 1 capsule (24 mcg total) by  mouth 2 (two) times daily with a meal.     nitrofurantoin (macrocrystal-monohydrate) 100 MG capsule  Commonly known as:  MACROBID  Take 1 capsule (100 mg total) by mouth 2 (two) times daily. For uti     NUVARING 0.12-0.015 MG/24HR vaginal ring  Generic drug:  etonogestrel-ethinyl estradiol  INSERT 1 RING VAGINALLY AND LEAVE FOR 3 CONSECUTIVE WEEKS, THEN REMOVE  FOR 1 WEEK     predniSONE 5 MG tablet  Commonly known as:  DELTASONE  Take 5 mg by mouth daily with breakfast.        Known medication allergies: Allergies  Allergen Reactions  . Nitrofuran Derivatives Rash  . Sulfa Antibiotics Rash    I appreciate the opportunity to take part in this Krystine's care. Please do not hesitate to contact me with questions.  Sincerely,   R. Edgar Frisk, MD

## 2014-12-20 NOTE — Assessment & Plan Note (Signed)
   Tiffany Underwood has been asked to follow up with her primary care physician regarding this issue.  She has verbalized understanding and has agreed to do so.

## 2014-12-20 NOTE — Assessment & Plan Note (Signed)
   Continue allergen avoidance measures as well as fluticasone nasal spray and cetirizine as needed.

## 2015-01-28 ENCOUNTER — Encounter (HOSPITAL_COMMUNITY): Payer: Self-pay

## 2015-01-28 ENCOUNTER — Emergency Department (HOSPITAL_COMMUNITY)
Admission: EM | Admit: 2015-01-28 | Discharge: 2015-01-28 | Disposition: A | Discharge status: Home / Self Care | Payer: BLUE CROSS/BLUE SHIELD | Attending: Emergency Medicine | Admitting: Emergency Medicine

## 2015-01-28 DIAGNOSIS — Z7951 Long term (current) use of inhaled steroids: Secondary | ICD-10-CM | POA: Diagnosis not present

## 2015-01-28 DIAGNOSIS — Y9389 Activity, other specified: Secondary | ICD-10-CM | POA: Diagnosis not present

## 2015-01-28 DIAGNOSIS — Y9289 Other specified places as the place of occurrence of the external cause: Secondary | ICD-10-CM | POA: Insufficient documentation

## 2015-01-28 DIAGNOSIS — X58XXXA Exposure to other specified factors, initial encounter: Secondary | ICD-10-CM | POA: Insufficient documentation

## 2015-01-28 DIAGNOSIS — Z86018 Personal history of other benign neoplasm: Secondary | ICD-10-CM | POA: Insufficient documentation

## 2015-01-28 DIAGNOSIS — Z79899 Other long term (current) drug therapy: Secondary | ICD-10-CM | POA: Diagnosis not present

## 2015-01-28 DIAGNOSIS — E079 Disorder of thyroid, unspecified: Secondary | ICD-10-CM | POA: Diagnosis not present

## 2015-01-28 DIAGNOSIS — Y998 Other external cause status: Secondary | ICD-10-CM | POA: Insufficient documentation

## 2015-01-28 DIAGNOSIS — Z8742 Personal history of other diseases of the female genital tract: Secondary | ICD-10-CM | POA: Diagnosis not present

## 2015-01-28 DIAGNOSIS — Z872 Personal history of diseases of the skin and subcutaneous tissue: Secondary | ICD-10-CM | POA: Insufficient documentation

## 2015-01-28 DIAGNOSIS — T783XXA Angioneurotic edema, initial encounter: Secondary | ICD-10-CM

## 2015-01-28 MED ORDER — FAMOTIDINE IN NACL 20-0.9 MG/50ML-% IV SOLN
20.0000 mg | Freq: Once | INTRAVENOUS | Status: AC
  Administered 2015-01-28: 20 mg via INTRAVENOUS
  Filled 2015-01-28: qty 50

## 2015-01-28 MED ORDER — PREDNISONE 50 MG PO TABS: ORAL_TABLET | ORAL | Status: DC

## 2015-01-28 MED ORDER — EPINEPHRINE 0.3 MG/0.3ML IJ SOAJ: 0.3000 mg | Freq: Once | INTRAMUSCULAR | Status: DC

## 2015-01-28 MED ORDER — DIPHENHYDRAMINE HCL 25 MG PO TABS: 25.0000 mg | ORAL_TABLET | Freq: Four times a day (QID) | ORAL | Status: DC

## 2015-01-28 MED ORDER — FAMOTIDINE 20 MG PO TABS: 20.0000 mg | ORAL_TABLET | Freq: Two times a day (BID) | ORAL | Status: DC

## 2015-01-28 MED ORDER — DIPHENHYDRAMINE HCL 50 MG/ML IJ SOLN
25.0000 mg | Freq: Once | INTRAMUSCULAR | Status: AC
  Administered 2015-01-28: 25 mg via INTRAVENOUS
  Filled 2015-01-28: qty 1

## 2015-01-28 MED ORDER — METHYLPREDNISOLONE SODIUM SUCC 125 MG IJ SOLR
125.0000 mg | Freq: Once | INTRAMUSCULAR | Status: AC
  Administered 2015-01-28: 125 mg via INTRAVENOUS
  Filled 2015-01-28: qty 2

## 2015-01-28 NOTE — ED Provider Notes (Signed)
CSN: [id number]     Arrival date & time 01/28/15  [id number] History   By signing my name below, I, [facility], attest that this documentation has been prepared under the direction and in the presence of Ezequiel Essex, MD. Electronically Signed: Virgel Bouquet, ED Scribe. 01/28/2015. 8:28 AM.   Chief Complaint  Patient presents with  . Angioedema   The history is provided by the patient. No language interpreter was used.     HPI Comments: Tiffany Underwood is a 44 y.o. female with hx of thyroid disease who presents to the Emergency Department complaining of moderately painful, gradually worsening upper and lower lip swelling that began 11 hours ago when the patient was in bed. Patient reports that she was lying in bed when a spot on the lower left side of her face began to swell that later spread to her lips. She notes previous symptoms 3-4 times in the past year that resolved with steroid use in the ED but notes symptoms are greater in severity for this current episode. Patient states that she has seen an allergist Dr. Ulla Potash 1 month ago who was unable to determine the cause of her symptoms and prescribed Zyrtec, Benadryl, and Zantac when symptoms occur. She notes no relief with Zyrtec, Benadryl, and Zantac and took last night after onset on symptoms.  Per patient, she endorses a recent sinus infection. She denies changes in soaps, detergents, and  foods or recent medication changes. Patient denies hx of DM or taking Lisinopril or other blood pressure medications. Patient not currently on steroids. She denies CP, SOB, vomiting, difficulty breathing, difficulty swallowing, and drooling.   Past Medical History  Diagnosis Date  . Thyroid disease   . Fibroids   . Angio-edema     Primary sent here to determine  . Urticaria    Past Surgical History  Procedure Laterality Date  . Uterine fibroid surgery     Family History  Problem Relation Age of Onset  . Cancer Mother 81     breast  . Cancer Maternal Grandmother     breast   Social History  Substance Use Topics  . Smoking status: Never Smoker   . Smokeless tobacco: Never Used  . Alcohol Use: No   OB History    Gravida Para Term Preterm AB TAB SAB Ectopic Multiple Living   1 1        1      Review of Systems  HENT: Positive for facial swelling (Upper and lower lips). Negative for drooling and trouble swallowing.   Respiratory: Negative for shortness of breath and wheezing.   Cardiovascular: Negative for chest pain.  Gastrointestinal: Negative for vomiting.      Allergies  Nitrofuran derivatives and Sulfa antibiotics  Home Medications   Prior to Admission medications   Medication Sig Start Date End Date Taking? Authorizing Provider  cetirizine (ZYRTEC) 10 MG tablet Take 10 mg by mouth daily.   Yes Historical Provider, MD  ipratropium (ATROVENT) 0.06 % nasal spray Place 2 sprays into both nostrils 4 (four) times daily. Patient taking differently: Place 2 sprays into both nostrils daily as needed for rhinitis.  02/15/14  Yes Waldemar Dickens, MD  levothyroxine (SYNTHROID, LEVOTHROID) 125 MCG tablet Take 125 mcg by mouth daily before breakfast.   Yes Historical Provider, MD  NUVARING 0.12-0.015 MG/24HR vaginal ring INSERT 1 RING VAGINALLY AND LEAVE FOR 3 CONSECUTIVE WEEKS, THEN REMOVE FOR 1 WEEK 08/31/14  Yes Jonnie Kind, MD  Pseudoeph-Doxylamine-DM-APAP (  DAYQUIL/NYQUIL COLD/FLU RELIEF PO) Take 2 capsules by mouth every 6 (six) hours as needed (cold/congestion).   Yes Historical Provider, MD  diphenhydrAMINE (BENADRYL) 25 MG tablet Take 1 tablet (25 mg total) by mouth every 6 (six) hours. 01/28/15   Ezequiel Essex, MD  EPINEPHrine 0.3 mg/0.3 mL IJ SOAJ injection Inject 0.3 mLs (0.3 mg total) into the muscle once. For severe allergic reaction with difficulty breathing or swallowing. 01/28/15   Ezequiel Essex, MD  famotidine (PEPCID) 20 MG tablet Take 1 tablet (20 mg total) by mouth 2 (two) times daily.  01/28/15   Ezequiel Essex, MD  fluticasone (FLONASE) 50 MCG/ACT nasal spray Place 1 spray into both nostrils daily. Use 1-2 times daily Patient not taking: Reported on 12/20/2014 11/16/14   Adelina Mings, MD  lubiprostone (AMITIZA) 24 MCG capsule Take 1 capsule (24 mcg total) by mouth 2 (two) times daily with a meal. Patient not taking: Reported on 01/28/2015 10/25/14   Jonnie Kind, MD  nitrofurantoin, macrocrystal-monohydrate, (MACROBID) 100 MG capsule Take 1 capsule (100 mg total) by mouth 2 (two) times daily. For uti Patient not taking: Reported on 11/16/2014 10/25/14   Jonnie Kind, MD  predniSONE (DELTASONE) 50 MG tablet 1 tablet PO daily 01/28/15   Ezequiel Essex, MD   BP 129/89 mmHg  Pulse 76  Temp(Src) 98.5 F (36.9 C) (Oral)  Resp 16  Ht 5\' 7"  (1.702 m)  Wt 135 lb (61.236 kg)  BMI 21.14 kg/m2  SpO2 100%  LMP 01/21/2015 Physical Exam  Constitutional: She is oriented to person, place, and time. She appears well-developed and well-nourished. No distress.  HENT:  Head: Normocephalic and atraumatic.  Mouth/Throat: Oropharynx is clear and moist. No oropharyngeal exudate.  Moderate edema to upper and lower lips. Tongue is normal. Oral pharynx and uvula is normal. No swelling of floor of mouth.  Eyes: Conjunctivae and EOM are normal. Pupils are equal, round, and reactive to light.  Neck: Normal range of motion. Neck supple.  No meningismus.  Cardiovascular: Normal rate, regular rhythm, normal heart sounds and intact distal pulses.   No murmur heard. Pulmonary/Chest: Effort normal and breath sounds normal. No respiratory distress.  Abdominal: Soft. There is no tenderness. There is no rebound and no guarding.  Musculoskeletal: Normal range of motion. She exhibits no edema or tenderness.  Neurological: She is alert and oriented to person, place, and time. No cranial nerve deficit. She exhibits normal muscle tone. Coordination normal.  No ataxia on finger to nose  bilaterally. No pronator drift. 5/5 strength throughout. CN 2-12 intact.Equal grip strength. Sensation intact.   Skin: Skin is warm.  Psychiatric: She has a normal mood and affect. Her behavior is normal.  Nursing note and vitals reviewed.   ED Course  Procedures  DIAGNOSTIC STUDIES: Oxygen Saturation is 100% on RA, normal by my interpretation.    COORDINATION OF CARE: 8:10 AM Will order medications and monitor in ED. Discussed treatment plan with pt at bedside and pt agreed to plan.  Labs Review Labs Reviewed - No data to display  Imaging Review No results found. I have personally reviewed and evaluated these images and lab results as part of my medical decision-making.   EKG Interpretation None      MDM   Final diagnoses:  Angioedema, initial encounter   Lip swelling ongoing since last night progressively worsening. No difficulty breathing or shortness of breath. History of similar episodes of unknown etiology patient has seen an allergist. No food or medication triggers.  She is not on ACE inhibitor.  Lip swelling without swelling of tongue or posterior pharynx. Controlling secretions.  Patient given steroids and antihistamines. We'll monitor.  Recheck at 10 AM. Lip swelling persists. Perhaps mildly improved. Oropharynx and tongue remain normal.  Recheck 12 PM. Patient resting comfortably, tolerating by mouth. Lower lip swelling is slightly improved. Upper lip remains edematous. Tongue and oropharynx remained normal. She is swallowing without difficulty.  Patient is requesting to go home. She is feeling better. Her airway remains patent. She is swallowing without difficulty. No chest pain or shortness of breath. We'll continue prednisone and antihistamine course. Patient given epinephrine pen though it seems that her allergist did not think this was an allergic reaction. She is instructed on its use.  She is requesting to go home. She understands that she needs to  return if her swelling becomes worse she develops any difficulty breathing difficulty swallowing or any other concerns.  CRITICAL CARE Performed by: Ezequiel Essex Total critical care time: 30 minutes Critical care time was exclusive of separately billable procedures and treating other patients. Critical care was necessary to treat or prevent imminent or life-threatening deterioration. Critical care was time spent personally by me on the following activities: development of treatment plan with patient and/or surrogate as well as nursing, discussions with consultants, evaluation of patient's response to treatment, examination of patient, obtaining history from patient or surrogate, ordering and performing treatments and interventions, ordering and review of laboratory studies, ordering and review of radiographic studies, pulse oximetry and re-evaluation of patient's condition.   I personally performed the services described in this documentation, which was scribed in my presence. The recorded information has been reviewed and is accurate.   Ezequiel Essex, MD 01/28/15 [id number]

## 2015-01-28 NOTE — Discharge Instructions (Signed)
Angioedema Continue the steroids and antihistamines. Follow up with your doctor. Return to the ED if you develop difficulty breathing, difficulty swallowing, chest pain, shortness of breath, or any other other concerns. Angioedema is a sudden swelling of tissues, often of the skin. It can occur on the face or genitals or in the abdomen or other body parts. The swelling usually develops over a short period and gets better in 24 to 48 hours. It often begins during the night and is found when the person wakes up. The person may also get red, itchy patches of skin (hives). Angioedema can be dangerous if it involves swelling of the air passages.  Depending on the cause, episodes of angioedema may only happen once, come back in unpredictable patterns, or repeat for several years and then gradually fade away.  CAUSES  Angioedema can be caused by an allergic reaction to various triggers. It can also result from nonallergic causes, including reactions to drugs, immune system disorders, viral infections, or an abnormal gene that is passed to you from your parents (hereditary). For some people with angioedema, the cause is unknown.  Some things that can trigger angioedema include:   Foods.   Medicines, such as ACE inhibitors, ARBs, nonsteroidal anti-inflammatory agents, or estrogen.   Latex.   Animal saliva.   Insect stings.   Dyes used in X-rays.   Mild injury.   Dental work.  Surgery.  Stress.   Sudden changes in temperature.   Exercise. SIGNS AND SYMPTOMS   Swelling of the skin.  Hives. If these are present, there is also intense itching.  Redness in the affected area.   Pain in the affected area.  Swollen lips or tongue.  Breathing problems. This may happen if the air passages swell.  Wheezing. If internal organs are involved, there may be:   Nausea.   Abdominal pain.   Vomiting.   Difficulty swallowing.   Difficulty passing urine. DIAGNOSIS   Your  health care provider will examine the affected area and take a medical and family history.  Various tests may be done to help determine the cause. Tests may include:  Allergy skin tests to see if the problem is an allergic reaction.   Blood tests to check for hereditary angioedema.   Tests to check for underlying diseases that could cause the condition.   A review of your medicines, including over-the-counter medicines, may be done. TREATMENT  Treatment will depend on the cause of the angioedema. Possible treatments include:   Removal of anything that triggered the condition (such as stopping certain medicines).   Medicines to treat symptoms or prevent attacks. Medicines given may include:   Antihistamines.   Epinephrine injection.   Steroids.   Hospitalization may be required for severe attacks. If the air passages are affected, it can be an emergency. Tubes may need to be placed to keep the airway open. HOME CARE INSTRUCTIONS   Take all medicines as directed by your health care provider.  If you were given medicines for emergency allergy treatment, always carry them with you.  Wear a medical bracelet as directed by your health care provider.   Avoid known triggers. SEEK MEDICAL CARE IF:   You have repeat attacks of angioedema.   Your attacks are more frequent or more severe despite preventive measures.   You have hereditary angioedema and are considering having children. It is important to discuss with your health care provider the risks of passing the condition on to your children. SEEK IMMEDIATE  MEDICAL CARE IF:   You have severe swelling of the mouth, tongue, or lips.  You have difficulty breathing.   You have difficulty swallowing.   You faint. MAKE SURE YOU:  Understand these instructions.  Will watch your condition.  Will get help right away if you are not doing well or get worse.   This information is not intended to replace advice given  to you by your health care provider. Make sure you discuss any questions you have with your health care provider.   Document Released: 04/08/2001 Document Revised: 02/18/2014 Document Reviewed: 09/21/2012 Elsevier Interactive Patient Education [employer].

## 2015-01-28 NOTE — ED Notes (Signed)
Pt states she her lips have been swelling for a while. States she has been to a dermatologist but that can't find any reason for a while. Pt denies other symptoms other than having a cold

## 2015-01-28 NOTE — ED Notes (Signed)
Pt given PO fluids.

## 2015-03-03 ENCOUNTER — Other Ambulatory Visit: Payer: Self-pay | Admitting: Obstetrics and Gynecology

## 2015-03-06 NOTE — Telephone Encounter (Signed)
Please have pt bring a urine sample by to confirm presence or absence of uti, before starting Rx of macrodantin escribed in

## 2015-04-25 ENCOUNTER — Other Ambulatory Visit: Payer: Self-pay | Admitting: *Deleted

## 2015-04-25 MED ORDER — LUBIPROSTONE 24 MCG PO CAPS: 24.0000 ug | ORAL_CAPSULE | Freq: Two times a day (BID) | ORAL | Status: DC

## 2015-06-20 ENCOUNTER — Ambulatory Visit: Payer: BLUE CROSS/BLUE SHIELD | Admitting: Allergy and Immunology

## 2015-08-02 DIAGNOSIS — R600 Localized edema: Secondary | ICD-10-CM | POA: Diagnosis not present

## 2015-08-29 ENCOUNTER — Other Ambulatory Visit: Payer: Self-pay | Admitting: Obstetrics and Gynecology

## 2015-08-29 DIAGNOSIS — Z1231 Encounter for screening mammogram for malignant neoplasm of breast: Secondary | ICD-10-CM

## 2015-09-04 ENCOUNTER — Other Ambulatory Visit: Payer: Self-pay | Admitting: Obstetrics and Gynecology

## 2015-09-07 ENCOUNTER — Telehealth: Payer: Self-pay | Admitting: *Deleted

## 2015-09-07 ENCOUNTER — Other Ambulatory Visit: Payer: Self-pay | Admitting: *Deleted

## 2015-09-07 NOTE — Telephone Encounter (Signed)
2 samples of Nuvaring left at front desk for pick up.   Pt also requesting RX for Nuvaring.

## 2015-09-11 ENCOUNTER — Ambulatory Visit (HOSPITAL_COMMUNITY)
Admission: RE | Admit: 2015-09-11 | Discharge: 2015-09-11 | Disposition: A | Discharge status: Home / Self Care | Payer: BLUE CROSS/BLUE SHIELD | Source: Ambulatory Visit | Attending: Obstetrics and Gynecology | Admitting: Obstetrics and Gynecology

## 2015-09-11 DIAGNOSIS — Z1231 Encounter for screening mammogram for malignant neoplasm of breast: Secondary | ICD-10-CM | POA: Diagnosis not present

## 2015-09-11 MED ORDER — ETONOGESTREL-ETHINYL ESTRADIOL 0.12-0.015 MG/24HR VA RING: VAGINAL_RING | VAGINAL | 11 refills | Status: DC

## 2015-09-11 NOTE — Telephone Encounter (Signed)
Needs recall appt.

## 2015-09-19 DIAGNOSIS — T783XXA Angioneurotic edema, initial encounter: Secondary | ICD-10-CM | POA: Diagnosis not present

## 2015-09-19 DIAGNOSIS — Z6830 Body mass index (BMI) 30.0-30.9, adult: Secondary | ICD-10-CM | POA: Diagnosis not present

## 2015-09-19 DIAGNOSIS — E6609 Other obesity due to excess calories: Secondary | ICD-10-CM | POA: Diagnosis not present

## 2015-09-19 DIAGNOSIS — M26602 Left temporomandibular joint disorder, unspecified: Secondary | ICD-10-CM | POA: Diagnosis not present

## 2015-09-20 DIAGNOSIS — T783XXD Angioneurotic edema, subsequent encounter: Secondary | ICD-10-CM | POA: Diagnosis not present

## 2015-09-20 DIAGNOSIS — Z1389 Encounter for screening for other disorder: Secondary | ICD-10-CM | POA: Diagnosis not present

## 2015-11-01 ENCOUNTER — Other Ambulatory Visit: Payer: BLUE CROSS/BLUE SHIELD | Admitting: Obstetrics and Gynecology

## 2015-11-03 DIAGNOSIS — E663 Overweight: Secondary | ICD-10-CM | POA: Diagnosis not present

## 2015-11-03 DIAGNOSIS — Z6829 Body mass index (BMI) 29.0-29.9, adult: Secondary | ICD-10-CM | POA: Diagnosis not present

## 2015-11-03 DIAGNOSIS — Z1389 Encounter for screening for other disorder: Secondary | ICD-10-CM | POA: Diagnosis not present

## 2015-11-03 DIAGNOSIS — E039 Hypothyroidism, unspecified: Secondary | ICD-10-CM | POA: Diagnosis not present

## 2015-11-14 ENCOUNTER — Other Ambulatory Visit: Payer: BLUE CROSS/BLUE SHIELD | Admitting: Obstetrics and Gynecology

## 2015-11-30 ENCOUNTER — Encounter: Payer: Self-pay | Admitting: Obstetrics and Gynecology

## 2015-11-30 ENCOUNTER — Ambulatory Visit (INDEPENDENT_AMBULATORY_CARE_PROVIDER_SITE_OTHER): Payer: BLUE CROSS/BLUE SHIELD | Admitting: Obstetrics and Gynecology

## 2015-11-30 ENCOUNTER — Other Ambulatory Visit (HOSPITAL_COMMUNITY)
Admission: RE | Admit: 2015-11-30 | Discharge: 2015-11-30 | Disposition: A | Discharge status: Home / Self Care | Payer: BLUE CROSS/BLUE SHIELD | Source: Ambulatory Visit | Attending: Obstetrics and Gynecology | Admitting: Obstetrics and Gynecology

## 2015-11-30 VITALS — Ht 66.54 in | Wt 188.0 lb

## 2015-11-30 DIAGNOSIS — Z01419 Encounter for gynecological examination (general) (routine) without abnormal findings: Secondary | ICD-10-CM | POA: Diagnosis not present

## 2015-11-30 DIAGNOSIS — Z1212 Encounter for screening for malignant neoplasm of rectum: Secondary | ICD-10-CM

## 2015-11-30 DIAGNOSIS — Z1151 Encounter for screening for human papillomavirus (HPV): Secondary | ICD-10-CM | POA: Insufficient documentation

## 2015-11-30 LAB — HEMOCCULT GUIAC POC 1CARD (OFFICE): FECAL OCCULT BLD: NEGATIVE

## 2015-11-30 NOTE — Progress Notes (Signed)
Patient ID: Tiffany Underwood, female   DOB: May 06, 1970, 45 y.o.   MRN: [id number] Assessment:  Annual Gyn Exam   Plan:  1. pap smear done, next pap due 1 year 2. Use nuvaring continuously with 1 year of refills 3. return annually or prn 4.   Annual mammogram advised Subjective:  Tiffany Underwood is a 45 y.o. female G1P1 who presents for annual exam. No LMP recorded. The patient has complaints today of no complaints at this time. Pt states that she has had uterine fibroids removed in 2008. Pt states that she uses the nuvaring as her contraceptive measures. Pt has not tried any medications for the relief of her symptoms. Pt denies vaginal bleeding, abdominal pain, and any other symptoms at this time.   The following portions of the patient's history were reviewed and updated as appropriate: allergies, current medications, past family history, past medical history, past social history, past surgical history and problem list. Past Medical History:  Diagnosis Date  . Angio-edema    Primary sent here to determine  . Fibroids   . Thyroid disease   . Urticaria     Past Surgical History:  Procedure Laterality Date  . UTERINE FIBROID SURGERY       Current Outpatient Prescriptions:  .  cetirizine (ZYRTEC) 10 MG tablet, Take 10 mg by mouth daily., Disp: , Rfl:  .  diphenhydrAMINE (BENADRYL) 25 MG tablet, Take 1 tablet (25 mg total) by mouth every 6 (six) hours., Disp: 20 tablet, Rfl: 0 .  EPINEPHrine 0.3 mg/0.3 mL IJ SOAJ injection, Inject 0.3 mLs (0.3 mg total) into the muscle once. For severe allergic reaction with difficulty breathing or swallowing., Disp: 1 Device, Rfl: 1 .  etonogestrel-ethinyl estradiol (NUVARING) 0.12-0.015 MG/24HR vaginal ring, INSERT 1 RING VAGINALLY AND LEAVE FOR 3 CONSECUTIVE WEEKS, THEN REMOVE FOR 1 WEEK, Disp: 1 each, Rfl: 11 .  levothyroxine (SYNTHROID, LEVOTHROID) 125 MCG tablet, Take 125 mcg by mouth daily before breakfast., Disp: , Rfl:  .  lubiprostone (AMITIZA)  24 MCG capsule, Take 1 capsule (24 mcg total) by mouth 2 (two) times daily with a meal., Disp: 180 capsule, Rfl: 4 .  NUVARING 0.12-0.015 MG/24HR vaginal ring, INSERT 1 RING VAGINALLY AND LEAVE FOR 3 CONSECUTIVE WEEKS, THEN REMOVE FOR 1 WEEK., Disp: 1 each, Rfl: 1 .  famotidine (PEPCID) 20 MG tablet, Take 1 tablet (20 mg total) by mouth 2 (two) times daily. (Patient not taking: Reported on 11/30/2015), Disp: 30 tablet, Rfl: 0 .  fluticasone (FLONASE) 50 MCG/ACT nasal spray, Place 1 spray into both nostrils daily. Use 1-2 times daily (Patient not taking: Reported on 11/30/2015), Disp: 16 g, Rfl: 2 .  ipratropium (ATROVENT) 0.06 % nasal spray, Place 2 sprays into both nostrils 4 (four) times daily. (Patient not taking: Reported on 11/30/2015), Disp: 15 mL, Rfl: 12 .  predniSONE (DELTASONE) 50 MG tablet, 1 tablet PO daily (Patient not taking: Reported on 11/30/2015), Disp: 5 tablet, Rfl: 0 .  Pseudoeph-Doxylamine-DM-APAP (DAYQUIL/NYQUIL COLD/FLU RELIEF PO), Take 2 capsules by mouth every 6 (six) hours as needed (cold/congestion)., Disp: , Rfl:   Review of Systems Constitutional: negative Gastrointestinal: negative Genitourinary: negative  Objective:  Ht 5' 6.53" (1.69 m)   Wt 188 lb (85.3 kg)   BMI 29.86 kg/m    BMI: Body mass index is 29.86 kg/m.  General Appearance: Alert, appropriate appearance for age. No acute distress HEENT: Grossly normal Neck / Thyroid:  Cardiovascular: RRR; normal S1, S2, no murmur Lungs: CTA bilaterally Back:  No CVAT Breast Exam: No masses or nodes.No dimpling, nipple retraction or discharge. Gastrointestinal: Soft, non-tender, no masses or organomegaly Pelvic Exam: External genitalia: normal general appearance Vaginal: normal mucosa without prolapse or lesions, normal without tenderness, induration or masses and normal rugae Cervix: normal appearance Adnexa: normal bimanual exam Uterus: non-tender, enlarged to 10 week size, and immobile Rectal: good sphincter  tone, no masses and guaiac negative Rectovaginal: not indicated Lymphatic Exam: Non-palpable nodes in neck, clavicular, axillary, or inguinal regions Skin: no rash or abnormalities Neurologic: Normal gait and speech, no tremor  Psychiatric: Alert and oriented, appropriate affect.  Urinalysis:Not done  Mallory Shirk. MD Pgr [phone] 4:20 PM   By signing my name below, I, Soijett Blue, attest that this documentation has been prepared under the direction and in the presence of Jonnie Kind, MD. Electronically Signed: Soijett Blue, ED Scribe. 11/30/15. 4:20 PM.  I personally performed the services described in this documentation, which was SCRIBED in my presence. The recorded information has been reviewed and considered accurate. It has been edited as necessary during review. Jonnie Kind, MD

## 2015-12-05 LAB — CYTOLOGY - PAP
Adequacy: ABSENT
Diagnosis: NEGATIVE
HPV: DETECTED — AB

## 2015-12-22 DIAGNOSIS — E890 Postprocedural hypothyroidism: Secondary | ICD-10-CM | POA: Diagnosis not present

## 2016-03-08 DIAGNOSIS — E890 Postprocedural hypothyroidism: Secondary | ICD-10-CM | POA: Diagnosis not present

## 2016-05-09 DIAGNOSIS — M25511 Pain in right shoulder: Secondary | ICD-10-CM | POA: Diagnosis not present

## 2016-05-09 DIAGNOSIS — M719 Bursopathy, unspecified: Secondary | ICD-10-CM | POA: Diagnosis not present

## 2016-05-09 DIAGNOSIS — E663 Overweight: Secondary | ICD-10-CM | POA: Diagnosis not present

## 2016-05-09 DIAGNOSIS — E890 Postprocedural hypothyroidism: Secondary | ICD-10-CM | POA: Diagnosis not present

## 2016-05-09 DIAGNOSIS — Z1389 Encounter for screening for other disorder: Secondary | ICD-10-CM | POA: Diagnosis not present

## 2016-05-09 DIAGNOSIS — Z6828 Body mass index (BMI) 28.0-28.9, adult: Secondary | ICD-10-CM | POA: Diagnosis not present

## 2016-07-25 DIAGNOSIS — M5384 Other specified dorsopathies, thoracic region: Secondary | ICD-10-CM | POA: Diagnosis not present

## 2016-07-25 DIAGNOSIS — M542 Cervicalgia: Secondary | ICD-10-CM | POA: Diagnosis not present

## 2016-07-25 DIAGNOSIS — M9902 Segmental and somatic dysfunction of thoracic region: Secondary | ICD-10-CM | POA: Diagnosis not present

## 2016-07-25 DIAGNOSIS — M9901 Segmental and somatic dysfunction of cervical region: Secondary | ICD-10-CM | POA: Diagnosis not present

## 2016-08-08 ENCOUNTER — Other Ambulatory Visit: Payer: Self-pay | Admitting: Obstetrics and Gynecology

## 2016-08-08 DIAGNOSIS — Z1231 Encounter for screening mammogram for malignant neoplasm of breast: Secondary | ICD-10-CM

## 2016-09-10 ENCOUNTER — Other Ambulatory Visit: Payer: Self-pay | Admitting: Obstetrics and Gynecology

## 2016-09-10 NOTE — Telephone Encounter (Signed)
6 month refil completed.

## 2016-09-16 ENCOUNTER — Ambulatory Visit (HOSPITAL_COMMUNITY)
Admission: RE | Admit: 2016-09-16 | Discharge: 2016-09-16 | Disposition: A | Discharge status: Home / Self Care | Payer: BLUE CROSS/BLUE SHIELD | Source: Ambulatory Visit | Attending: Obstetrics and Gynecology | Admitting: Obstetrics and Gynecology

## 2016-09-16 DIAGNOSIS — Z1231 Encounter for screening mammogram for malignant neoplasm of breast: Secondary | ICD-10-CM | POA: Diagnosis not present

## 2016-09-18 DIAGNOSIS — T783XXA Angioneurotic edema, initial encounter: Secondary | ICD-10-CM | POA: Diagnosis not present

## 2016-09-18 DIAGNOSIS — R220 Localized swelling, mass and lump, head: Secondary | ICD-10-CM | POA: Diagnosis not present

## 2016-09-18 DIAGNOSIS — Z6827 Body mass index (BMI) 27.0-27.9, adult: Secondary | ICD-10-CM | POA: Diagnosis not present

## 2016-11-12 DIAGNOSIS — B029 Zoster without complications: Secondary | ICD-10-CM | POA: Diagnosis not present

## 2016-11-12 DIAGNOSIS — Z6828 Body mass index (BMI) 28.0-28.9, adult: Secondary | ICD-10-CM | POA: Diagnosis not present

## 2016-12-02 ENCOUNTER — Other Ambulatory Visit (HOSPITAL_COMMUNITY)
Admission: RE | Admit: 2016-12-02 | Discharge: 2016-12-02 | Disposition: A | Discharge status: Home / Self Care | Payer: BLUE CROSS/BLUE SHIELD | Source: Ambulatory Visit | Attending: Obstetrics and Gynecology | Admitting: Obstetrics and Gynecology

## 2016-12-02 ENCOUNTER — Encounter: Payer: Self-pay | Admitting: Obstetrics and Gynecology

## 2016-12-02 ENCOUNTER — Ambulatory Visit (INDEPENDENT_AMBULATORY_CARE_PROVIDER_SITE_OTHER): Payer: BLUE CROSS/BLUE SHIELD | Admitting: Obstetrics and Gynecology

## 2016-12-02 VITALS — BP 120/70 | HR 65 | Ht 67.0 in | Wt 183.2 lb

## 2016-12-02 DIAGNOSIS — Z124 Encounter for screening for malignant neoplasm of cervix: Secondary | ICD-10-CM | POA: Insufficient documentation

## 2016-12-02 DIAGNOSIS — Z01419 Encounter for gynecological examination (general) (routine) without abnormal findings: Secondary | ICD-10-CM

## 2016-12-02 NOTE — Progress Notes (Signed)
Patient ID: Tiffany Underwood, female   DOB: 1970/10/30, 46 y.o.   MRN: [id number]  Assessment:  1. Annual Gyn Exam 2. Hx of +HPV on PAP in 2017 Plan:  1. pap smear done, next pap due 1 year 2. return annually or prn 3    Annual mammogram advised after age 54 Subjective:  Tiffany Underwood is a 46 y.o. female G1P1 who presents for annual exam. Patient's last menstrual period was 11/11/2016 (exact date). The patient has no complaints today. She had her last mammogram on 09/16/2016, which was normal. Pt is on the NuvaRing. She will occasionally bleed after or during intercourse.   The following portions of the patient's history were reviewed and updated as appropriate: allergies, current medications, past family history, past medical history, past social history, past surgical history and problem list. Past Medical History:  Diagnosis Date  . Angio-edema    Primary sent here to determine  . Fibroids   . Thyroid disease   . Urticaria     Past Surgical History:  Procedure Laterality Date  . UTERINE FIBROID SURGERY       Current Outpatient Prescriptions:  .  cetirizine (ZYRTEC) 10 MG tablet, Take 10 mg by mouth daily., Disp: , Rfl:  .  diphenhydrAMINE (BENADRYL) 25 MG tablet, Take 1 tablet (25 mg total) by mouth every 6 (six) hours., Disp: 20 tablet, Rfl: 0 .  EPINEPHrine 0.3 mg/0.3 mL IJ SOAJ injection, Inject 0.3 mLs (0.3 mg total) into the muscle once. For severe allergic reaction with difficulty breathing or swallowing., Disp: 1 Device, Rfl: 1 .  etonogestrel-ethinyl estradiol (NUVARING) 0.12-0.015 MG/24HR vaginal ring, Leave in 24 days, remove for 4 days then reinsert next ring., Disp: 3 each, Rfl: 1 .  levothyroxine (SYNTHROID, LEVOTHROID) 125 MCG tablet, Take 125 mcg by mouth daily before breakfast., Disp: , Rfl:  .  predniSONE (DELTASONE) 50 MG tablet, 1 tablet PO daily (Patient not taking: Reported on 11/30/2015), Disp: 5 tablet, Rfl: 0 .  Pseudoeph-Doxylamine-DM-APAP (DAYQUIL/NYQUIL  COLD/FLU RELIEF PO), Take 2 capsules by mouth every 6 (six) hours as needed (cold/congestion)., Disp: , Rfl:   Review of Systems Constitutional: negative Gastrointestinal: negative Genitourinary: negative  Objective:  BP 120/70 (BP Location: Right Arm, Patient Position: Sitting, Cuff Size: Normal)   Pulse 65   Ht 5\' 7"  (1.702 m)   Wt 183 lb 3.2 oz (83.1 kg)   LMP 11/11/2016 (Exact Date)   BMI 28.69 kg/m    BMI: Body mass index is 28.69 kg/m.  General Appearance: Alert, appropriate appearance for age. No acute distress HEENT: Grossly normal Neck / Thyroid:  Cardiovascular: RRR; normal S1, S2, no murmur Lungs: CTA bilaterally Back: No CVAT Breast Exam: even tissue bilaterally,  Axilla: negative Gastrointestinal: Soft, non-tender, no masses or organomegaly Pelvic Exam: External genitalia: normal Cervix: bleeds easily Uterus: tiny, NuvaRing in place Rectovaginal: strong muscles, good support, hemoccult negative Lymphatic Exam: Non-palpable nodes in neck, clavicular, axillary, or inguinal regions  Skin: no rash or abnormalities Neurologic: Normal gait and speech, no tremor  Psychiatric: Alert and oriented, appropriate affect.  Urinalysis:Not done  Mallory Shirk. MD Pgr [phone] 4:06 PM   By signing my name below, I, Margit Banda, attest that this documentation has been prepared under the direction and in the presence of Jonnie Kind, MD. Electronically Signed: Margit Banda, Medical Scribe. 12/02/16. 4:06 PM.  I personally performed the services described in this documentation, which was SCRIBED in my presence. The recorded information has been reviewed and considered  accurate. It has been edited as necessary during review. Jonnie Kind, MD

## 2016-12-05 LAB — CYTOLOGY - PAP
DIAGNOSIS: NEGATIVE
HPV: NOT DETECTED

## 2017-02-15 ENCOUNTER — Other Ambulatory Visit: Payer: Self-pay | Admitting: Obstetrics and Gynecology

## 2017-02-16 NOTE — Telephone Encounter (Signed)
Refilled nuvaring x 1 yr

## 2017-03-13 DIAGNOSIS — E663 Overweight: Secondary | ICD-10-CM | POA: Diagnosis not present

## 2017-03-13 DIAGNOSIS — Z1389 Encounter for screening for other disorder: Secondary | ICD-10-CM | POA: Diagnosis not present

## 2017-03-13 DIAGNOSIS — Z6829 Body mass index (BMI) 29.0-29.9, adult: Secondary | ICD-10-CM | POA: Diagnosis not present

## 2017-03-13 DIAGNOSIS — E890 Postprocedural hypothyroidism: Secondary | ICD-10-CM | POA: Diagnosis not present

## 2017-04-25 DIAGNOSIS — E890 Postprocedural hypothyroidism: Secondary | ICD-10-CM | POA: Diagnosis not present

## 2017-05-22 DIAGNOSIS — J310 Chronic rhinitis: Secondary | ICD-10-CM | POA: Diagnosis not present

## 2017-05-22 DIAGNOSIS — T783XXS Angioneurotic edema, sequela: Secondary | ICD-10-CM | POA: Diagnosis not present

## 2017-05-22 DIAGNOSIS — Z1389 Encounter for screening for other disorder: Secondary | ICD-10-CM | POA: Diagnosis not present

## 2017-05-22 DIAGNOSIS — E663 Overweight: Secondary | ICD-10-CM | POA: Diagnosis not present

## 2017-05-22 DIAGNOSIS — J383 Other diseases of vocal cords: Secondary | ICD-10-CM | POA: Diagnosis not present

## 2017-05-22 DIAGNOSIS — Z6829 Body mass index (BMI) 29.0-29.9, adult: Secondary | ICD-10-CM | POA: Diagnosis not present

## 2017-08-07 ENCOUNTER — Other Ambulatory Visit: Payer: Self-pay | Admitting: Obstetrics and Gynecology

## 2017-08-07 DIAGNOSIS — Z1231 Encounter for screening mammogram for malignant neoplasm of breast: Secondary | ICD-10-CM

## 2017-09-01 DIAGNOSIS — R300 Dysuria: Secondary | ICD-10-CM | POA: Diagnosis not present

## 2017-09-01 DIAGNOSIS — Z6829 Body mass index (BMI) 29.0-29.9, adult: Secondary | ICD-10-CM | POA: Diagnosis not present

## 2017-09-01 DIAGNOSIS — B373 Candidiasis of vulva and vagina: Secondary | ICD-10-CM | POA: Diagnosis not present

## 2017-09-01 DIAGNOSIS — N390 Urinary tract infection, site not specified: Secondary | ICD-10-CM | POA: Diagnosis not present

## 2017-09-29 ENCOUNTER — Ambulatory Visit (HOSPITAL_COMMUNITY)
Admission: RE | Admit: 2017-09-29 | Discharge: 2017-09-29 | Disposition: A | Discharge status: Home / Self Care | Payer: BLUE CROSS/BLUE SHIELD | Source: Ambulatory Visit | Attending: Obstetrics and Gynecology | Admitting: Obstetrics and Gynecology

## 2017-09-29 DIAGNOSIS — Z1231 Encounter for screening mammogram for malignant neoplasm of breast: Secondary | ICD-10-CM

## 2017-12-03 ENCOUNTER — Encounter: Payer: Self-pay | Admitting: Obstetrics and Gynecology

## 2017-12-03 ENCOUNTER — Ambulatory Visit (INDEPENDENT_AMBULATORY_CARE_PROVIDER_SITE_OTHER): Payer: BLUE CROSS/BLUE SHIELD | Admitting: Obstetrics and Gynecology

## 2017-12-03 ENCOUNTER — Other Ambulatory Visit (HOSPITAL_COMMUNITY)
Admission: RE | Admit: 2017-12-03 | Discharge: 2017-12-03 | Disposition: A | Discharge status: Home / Self Care | Payer: BLUE CROSS/BLUE SHIELD | Source: Ambulatory Visit | Attending: Obstetrics and Gynecology | Admitting: Obstetrics and Gynecology

## 2017-12-03 VITALS — BP 132/84 | HR 74 | Ht 67.0 in | Wt 185.5 lb

## 2017-12-03 DIAGNOSIS — R3915 Urgency of urination: Secondary | ICD-10-CM

## 2017-12-03 DIAGNOSIS — Z01419 Encounter for gynecological examination (general) (routine) without abnormal findings: Secondary | ICD-10-CM

## 2017-12-03 LAB — POCT URINALYSIS DIPSTICK
Glucose, UA: NEGATIVE
Ketones, UA: NEGATIVE
LEUKOCYTES UA: NEGATIVE
NITRITE UA: NEGATIVE
PROTEIN UA: NEGATIVE

## 2017-12-03 MED ORDER — CEPHALEXIN 500 MG PO CAPS: 500.0000 mg | ORAL_CAPSULE | Freq: Four times a day (QID) | ORAL | 0 refills | Status: DC

## 2017-12-03 MED ORDER — ETONOGESTREL-ETHINYL ESTRADIOL 0.12-0.015 MG/24HR VA RING: VAGINAL_RING | VAGINAL | 4 refills | Status: DC

## 2017-12-03 MED ORDER — KETOCONAZOLE 2 % EX CREA: 1.0000 "application " | TOPICAL_CREAM | Freq: Every day | CUTANEOUS | 1 refills | Status: DC

## 2017-12-03 NOTE — Progress Notes (Signed)
Patient ID: Tiffany Underwood, female   DOB: 1970-06-10, 47 y.o.   MRN: [id number]  Assessment:  HX of incomplete treatment of UTI 11/2017 Annual exam  Repeat C&S urine Tinea corporis under breasts 10 week sized uterus fibroid uterus Contraception management Left axillary lymph node 1 X 1.5 cm Borderline HTN Annual Gyn Exam Plan:  1. 3 month re-check left breast 2. Rx Ketoconazole 3. pap smear done, next pap due 1 year 4. return annually or prn 5. Annual mammogram advised after age 63 Subjective:  Tiffany Underwood is a 47 y.o. female G1P1 who presents for annual exam. Patient's last menstrual period was 11/24/2017. The patient has complaints today of none.  Works as Freight forwarder in Therapist, art. Says is not very stressful but is getting better. Says that a few weeks ago had itchiness under breast and redness but has gotten better upon examination Pt says she is still on NuvaRing and is not having issues but is considering IUD, she thinks she is going through pre-menopause due to having night sweats The following portions of the patient's history were reviewed and updated as appropriate: allergies, current medications, past family history, past medical history, past social history, past surgical history and problem list. Past Medical History:  Diagnosis Date  . Angio-edema    Primary sent here to determine  . Fibroids   . Thyroid disease   . Urticaria     Past Surgical History:  Procedure Laterality Date  . UTERINE FIBROID SURGERY       Current Outpatient Medications:  .  ALPRAZolam (XANAX XR) 1 MG 24 hr tablet, alprazolam ER 1 mg tablet,extended release 24 hr, Disp: , Rfl:  .  cetirizine (ZYRTEC) 10 MG tablet, Take 10 mg by mouth daily., Disp: , Rfl:  .  diphenhydrAMINE (BENADRYL) 25 MG tablet, Take 1 tablet (25 mg total) by mouth every 6 (six) hours., Disp: 20 tablet, Rfl: 0 .  etonogestrel-ethinyl estradiol (NUVARING) 0.12-0.015 MG/24HR vaginal ring, LEAVE IN 24 DAYS, REMOVE FOR  4 DAYS THEN REINSERT NEXT RING, Disp: 3 each, Rfl: 4 .  levothyroxine (SYNTHROID, LEVOTHROID) 125 MCG tablet, Take 125 mcg by mouth daily before breakfast., Disp: , Rfl:  .  EPINEPHrine 0.3 mg/0.3 mL IJ SOAJ injection, Inject 0.3 mLs (0.3 mg total) into the muscle once. For severe allergic reaction with difficulty breathing or swallowing. (Patient not taking: Reported on 12/03/2017), Disp: 1 Device, Rfl: 1  Review of Systems Constitutional: negative Gastrointestinal: negative Genitourinary: normal  Objective:  BP 132/84 (BP Location: Right Arm, Patient Position: Sitting, Cuff Size: Normal)   Pulse 74   Ht 5\' 7"  (1.702 m)   Wt 185 lb 8 oz (84.1 kg)   LMP 11/24/2017   BMI 29.05 kg/m    BMI: Body mass index is 29.05 kg/m.  General Appearance: Alert, appropriate appearance for age. No acute distress HEENT: Grossly normal Neck / Thyroid:  Cardiovascular: RRR; normal S1, S2, no murmur Lungs: CTA bilaterally Back: No CVAT Breast Exam: No masses or nodes.No dimpling, nipple retraction or discharge. Mobile soft rubbery lymph node in left breast at edge of muscle outside of breast tissue no change in shape. Pt says tender to palpation Gastrointestinal: Soft, non-tender, no masses or organomegaly Pelvic Exam: VULVA: normal appearing vulva with no masses, tenderness or lesions,  VAGINA: normal CERVIX: normal in appearance, white secretions, spotting after pap exam UTERUS: enlarged to 10 week's size,  larger on the right consistent with fundal fibroid  RECTAL: guaiac negative stool obtained, PAP:  Pap smear done today. Lymphatic Exam: Non-palpable nodes in neck, clavicular, axillary, or inguinal regions Skin: under right breast superficial erythema, correlation to  tinea corpus Neurologic: Normal gait and speech, no tremor  Psychiatric: Alert and oriented, appropriate affect.  Urinalysis:+ hematuria  By signing my name below, I, Samul Dada, attest that this documentation has been  prepared under the direction and in the presence of Jonnie Kind, MD. Electronically Signed: [place]. 12/03/17. 8:55 AM.  /I personally performed the services described in this documentation, which was SCRIBED in my presence. The recorded information has been reviewed and considered accurate. It has been edited as necessary during review. Jonnie Kind, MD

## 2017-12-05 LAB — CYTOLOGY - PAP
DIAGNOSIS: NEGATIVE
HPV (WINDOPATH): NOT DETECTED

## 2017-12-05 LAB — URINE CULTURE: Organism ID, Bacteria: NO GROWTH

## 2017-12-10 DIAGNOSIS — Z1389 Encounter for screening for other disorder: Secondary | ICD-10-CM | POA: Diagnosis not present

## 2017-12-10 DIAGNOSIS — Z0001 Encounter for general adult medical examination with abnormal findings: Secondary | ICD-10-CM | POA: Diagnosis not present

## 2017-12-10 DIAGNOSIS — E890 Postprocedural hypothyroidism: Secondary | ICD-10-CM | POA: Diagnosis not present

## 2017-12-10 DIAGNOSIS — J383 Other diseases of vocal cords: Secondary | ICD-10-CM | POA: Diagnosis not present

## 2017-12-10 DIAGNOSIS — Z6830 Body mass index (BMI) 30.0-30.9, adult: Secondary | ICD-10-CM | POA: Diagnosis not present

## 2018-03-04 ENCOUNTER — Encounter: Payer: Self-pay | Admitting: Obstetrics and Gynecology

## 2018-03-04 ENCOUNTER — Ambulatory Visit: Payer: BLUE CROSS/BLUE SHIELD | Admitting: Obstetrics and Gynecology

## 2018-03-04 ENCOUNTER — Other Ambulatory Visit (HOSPITAL_COMMUNITY): Payer: Self-pay | Admitting: Obstetrics and Gynecology

## 2018-03-04 VITALS — BP 125/80 | HR 71 | Ht 67.0 in | Wt 193.0 lb

## 2018-03-04 DIAGNOSIS — N6320 Unspecified lump in the left breast, unspecified quadrant: Secondary | ICD-10-CM | POA: Diagnosis not present

## 2018-03-04 NOTE — Progress Notes (Signed)
[facility] Visit  @DATE @            Patient name: Tiffany Underwood MRN [id number]  Date of birth: 1970/04/23  CC & HPI:  Tiffany Underwood is a 48 y.o. female presenting today for 11-month follow-up regarding a rubbery area in the lateral aspects of the left breast which is been present, mildly tender, seems to be intermittently less noticeable by the patient.  She reports that it is occasionally tender.  She is also noticed an area on the right breast on the medial aspects over the rib cage that she had some concern about its comes and goes.  Today's exam by me finds normal tissues in the right breast.  The left breast still has a 1 x 1.5 cm smooth rubbery mobile area at 10:00 at the very far lateral aspect of the breast in the anterior axillary line  ROS:  ROS   Pertinent History Reviewed:   Reviewed: Significant for no redness, had mammogram just before visit last year Medical         Past Medical History:  Diagnosis Date  . Angio-edema    Primary sent here to determine  . Fibroids   . Thyroid disease   . Urticaria                               Surgical Hx:    Past Surgical History:  Procedure Laterality Date  . UTERINE FIBROID SURGERY     Medications: Reviewed & Updated - see associated section                       Current Outpatient Medications:  .  ALPRAZolam (XANAX XR) 1 MG 24 hr tablet, alprazolam ER 1 mg tablet,extended release 24 hr, Disp: , Rfl:  .  cetirizine (ZYRTEC) 10 MG tablet, Take 10 mg by mouth daily., Disp: , Rfl:  .  diphenhydrAMINE (BENADRYL) 25 MG tablet, Take 1 tablet (25 mg total) by mouth every 6 (six) hours., Disp: 20 tablet, Rfl: 0 .  etonogestrel-ethinyl estradiol (NUVARING) 0.12-0.015 MG/24HR vaginal ring, LEAVE IN 24 DAYS, REMOVE FOR 4 DAYS THEN REINSERT NEXT RING, Disp: 3 each, Rfl: 4 .  ketoconazole (NIZORAL) 2 % cream, Apply 1 application topically daily. Apply to irritated skin when dry.5x/wk, Disp: 60 g, Rfl: 1 .  levothyroxine  (SYNTHROID, LEVOTHROID) 125 MCG tablet, Take 125 mcg by mouth daily before breakfast., Disp: , Rfl:  .  cephALEXin (KEFLEX) 500 MG capsule, Take 1 capsule (500 mg total) by mouth 4 (four) times daily., Disp: 28 capsule, Rfl: 0 .  EPINEPHrine 0.3 mg/0.3 mL IJ SOAJ injection, Inject 0.3 mLs (0.3 mg total) into the muscle once. For severe allergic reaction with difficulty breathing or swallowing. (Patient not taking: Reported on 12/03/2017), Disp: 1 Device, Rfl: 1   Social History: Reviewed -  reports that she has never smoked. She has never used smokeless tobacco.  Objective Findings:  Vitals: Blood pressure 125/80, pulse 71, height 5\' 7"  (1.702 m), weight 193 lb (87.5 kg).  PHYSICAL EXAMINATION General appearance - alert, well appearing, and in no distress Mental status - alert, oriented to person, place, and time Chest -  Heart - normal rate and regular rhythm Abdomen - soft, nontender, nondistended, no masses or organomegaly Breasts - right breast normal without mass, skin or nipple changes or axillary nodes, unchanged from previous exams, left breast described  above in HPI Skin -some small area of tinea on the right breast patient is been given medication in the past No dimpling retraction or tender areas on either breast Rectal - rectal exam not indicated    Assessment & Plan:   A:  1. Breast mass left suspected lymph node  P:  1. Diagnostic mammogram ordered after discussion of monitoring options

## 2018-03-10 ENCOUNTER — Ambulatory Visit (HOSPITAL_COMMUNITY)
Admission: RE | Admit: 2018-03-10 | Discharge: 2018-03-10 | Disposition: A | Discharge status: Home / Self Care | Payer: BLUE CROSS/BLUE SHIELD | Source: Ambulatory Visit | Attending: Obstetrics and Gynecology | Admitting: Obstetrics and Gynecology

## 2018-03-10 DIAGNOSIS — N6320 Unspecified lump in the left breast, unspecified quadrant: Secondary | ICD-10-CM | POA: Diagnosis not present

## 2018-03-10 DIAGNOSIS — R928 Other abnormal and inconclusive findings on diagnostic imaging of breast: Secondary | ICD-10-CM | POA: Diagnosis not present

## 2018-06-04 DIAGNOSIS — N342 Other urethritis: Secondary | ICD-10-CM | POA: Diagnosis not present

## 2018-08-06 DIAGNOSIS — E890 Postprocedural hypothyroidism: Secondary | ICD-10-CM | POA: Diagnosis not present

## 2018-10-15 ENCOUNTER — Other Ambulatory Visit (HOSPITAL_COMMUNITY): Payer: Self-pay | Admitting: Obstetrics and Gynecology

## 2018-10-15 DIAGNOSIS — Z1231 Encounter for screening mammogram for malignant neoplasm of breast: Secondary | ICD-10-CM

## 2018-10-23 DIAGNOSIS — F419 Anxiety disorder, unspecified: Secondary | ICD-10-CM | POA: Diagnosis not present

## 2018-10-26 ENCOUNTER — Other Ambulatory Visit: Payer: Self-pay

## 2018-10-26 ENCOUNTER — Ambulatory Visit (HOSPITAL_COMMUNITY)
Admission: RE | Admit: 2018-10-26 | Discharge: 2018-10-26 | Disposition: A | Discharge status: Home / Self Care | Payer: BC Managed Care – PPO | Source: Ambulatory Visit | Attending: Obstetrics and Gynecology | Admitting: Obstetrics and Gynecology

## 2018-10-26 DIAGNOSIS — Z1231 Encounter for screening mammogram for malignant neoplasm of breast: Secondary | ICD-10-CM | POA: Diagnosis not present

## 2018-12-16 ENCOUNTER — Other Ambulatory Visit: Payer: Self-pay | Admitting: Obstetrics and Gynecology

## 2019-01-12 ENCOUNTER — Ambulatory Visit
Admission: EM | Admit: 2019-01-12 | Discharge: 2019-01-12 | Disposition: A | Discharge status: Home / Self Care | Payer: BC Managed Care – PPO | Attending: Emergency Medicine | Admitting: Emergency Medicine

## 2019-01-12 ENCOUNTER — Other Ambulatory Visit: Payer: Self-pay

## 2019-01-12 DIAGNOSIS — Z20828 Contact with and (suspected) exposure to other viral communicable diseases: Secondary | ICD-10-CM | POA: Diagnosis not present

## 2019-01-12 DIAGNOSIS — Z20822 Contact with and (suspected) exposure to covid-19: Secondary | ICD-10-CM

## 2019-01-12 NOTE — ED Triage Notes (Signed)
pts husband tested positive on Saturday, pt currently has no symptoms

## 2019-01-12 NOTE — Discharge Instructions (Signed)
COVID testing ordered.  It will take between 5-7 days for test results.  Someone will contact you regarding abnormal results.    In the meantime: You should remain isolated in your home for 10 days from symptom onset AND greater than 72 hours after symptoms resolution (absence of fever without the use of fever-reducing medication and improvement in respiratory symptoms), whichever is longer OR 14 days from exposure Get plenty of rest and push fluids Use OTC zyrtec for nasal congestion, runny nose, and/or sore throat Use OTC flonase for nasal congestion and runny nose Use OTC medications like ibuprofen or tylenol as needed fever or pain Call or go to the ED if you have any new or worsening symptoms such as fever, cough, shortness of breath, chest tightness, chest pain, turning blue, changes in mental status, etc..Marland Kitchen

## 2019-01-12 NOTE — ED Provider Notes (Signed)
[facility]   [id number] 01/12/19 Arrival Time: 38   CC: COVID test; COVID exposure  SUBJECTIVE: History from: patient.  Tiffany Underwood is a 48 y.o. female who presents for COVID testing.  Husband tested positive for COVID 3 days ago.  Denies recent travel.  Denies aggravating or alleviating symptoms.  Denies previous COVID infection.   Denies fever, chills, fatigue, nasal congestion, rhinorrhea, sore throat, cough, SOB, wheezing, chest pain, nausea, vomiting, changes in bowel or bladder habits.     ROS: As per HPI.  All other pertinent ROS negative.     Past Medical History:  Diagnosis Date  . Angio-edema    Primary sent here to determine  . Fibroids   . Thyroid disease   . Urticaria    Past Surgical History:  Procedure Laterality Date  . UTERINE FIBROID SURGERY     Allergies  Allergen Reactions  . Nitrofuran Derivatives Rash  . Sulfa Antibiotics Rash   No current facility-administered medications on file prior to encounter.    Current Outpatient Medications on File Prior to Encounter  Medication Sig Dispense Refill  . ALPRAZolam (XANAX XR) 1 MG 24 hr tablet alprazolam ER 1 mg tablet,extended release 24 hr    . cetirizine (ZYRTEC) 10 MG tablet Take 10 mg by mouth daily.    Marland Kitchen EPINEPHrine 0.3 mg/0.3 mL IJ SOAJ injection Inject 0.3 mLs (0.3 mg total) into the muscle once. For severe allergic reaction with difficulty breathing or swallowing. (Patient not taking: Reported on 12/03/2017) 1 Device 1  . etonogestrel-ethinyl estradiol (NUVARING) 0.12-0.015 MG/24HR vaginal ring INSERT 1 RING VAGINALLY FOR 24 DAYS THEN REMOVE FOR 4 DAYS AND INSERT NEW RING 3 each 4  . ketoconazole (NIZORAL) 2 % cream Apply 1 application topically daily. Apply to irritated skin when dry.5x/wk 60 g 1  . levothyroxine (SYNTHROID, LEVOTHROID) 125 MCG tablet Take 125 mcg by mouth daily before breakfast.    . [DISCONTINUED] diphenhydrAMINE (BENADRYL) 25 MG tablet Take 1 tablet (25 mg total) by  mouth every 6 (six) hours. 20 tablet 0   Social History   Socioeconomic History  . Marital status: Married    Spouse name: Not on file  . Number of children: Not on file  . Years of education: Not on file  . Highest education level: Not on file  Occupational History  . Not on file  Social Needs  . Financial resource strain: Not on file  . Food insecurity    Worry: Not on file    Inability: Not on file  . Transportation needs    Medical: Not on file    Non-medical: Not on file  Tobacco Use  . Smoking status: Never Smoker  . Smokeless tobacco: Never Used  Substance and Sexual Activity  . Alcohol use: No  . Drug use: No  . Sexual activity: Yes    Birth control/protection: Inserts  Lifestyle  . Physical activity    Days per week: Not on file    Minutes per session: Not on file  . Stress: Not on file  Relationships  . Social Herbalist on phone: Not on file    Gets together: Not on file    Attends religious service: Not on file    Active member of club or organization: Not on file    Attends meetings of clubs or organizations: Not on file    Relationship status: Not on file  . Intimate partner violence    Fear of current  or ex partner: Not on file    Emotionally abused: Not on file    Physically abused: Not on file    Forced sexual activity: Not on file  Other Topics Concern  . Not on file  Social History Narrative  . Not on file   Family History  Problem Relation Age of Onset  . Cancer Mother 78       breast  . Cancer Maternal Grandmother        breast    OBJECTIVE:  Vitals:   01/12/19 1354  BP: (!) 162/89  Pulse: 80  Resp: 18  Temp: 98.6 F (37 C)  SpO2: 100%     General appearance: alert; well-appearing, nontoxic; speaking in full sentences and tolerating own secretions HEENT: NCAT; Ears: EACs clear, TMs pearly gray; Eyes: PERRL.  EOM grossly intact. Nose: nares patent without rhinorrhea, Throat: oropharynx clear, tonsils non  erythematous or enlarged, uvula midline  Neck: supple without LAD Lungs: unlabored respirations, symmetrical air entry; cough: absent; no respiratory distress; CTAB Heart: regular rate and rhythm.   Skin: warm and dry Psychological: alert and cooperative; normal mood and affect   ASSESSMENT & PLAN:  1. Close exposure to COVID-19 virus   2. Exposure to COVID-19 virus   3. Encounter for laboratory testing for COVID-19 virus     COVID testing ordered.  It will take between 5-7 days for test results.  Someone will contact you regarding abnormal results.    In the meantime: You should remain isolated in your home for 10 days from symptom onset AND greater than 72 hours after symptoms resolution (absence of fever without the use of fever-reducing medication and improvement in respiratory symptoms), whichever is longer OR 14 days from exposure Get plenty of rest and push fluids Use OTC zyrtec for nasal congestion, runny nose, and/or sore throat Use OTC flonase for nasal congestion and runny nose Use OTC medications like ibuprofen or tylenol as needed fever or pain Call or go to the ED if you have any new or worsening symptoms such as fever, cough, shortness of breath, chest tightness, chest pain, turning blue, changes in mental status, etc...   Reviewed expectations re: course of current medical issues. Questions answered. Outlined signs and symptoms indicating need for more acute intervention. Patient verbalized understanding. After Visit Summary given.         Lestine Box, PA-C 01/12/19 1445

## 2019-01-14 LAB — NOVEL CORONAVIRUS, NAA: SARS-CoV-2, NAA: NOT DETECTED

## 2019-04-28 DIAGNOSIS — F419 Anxiety disorder, unspecified: Secondary | ICD-10-CM | POA: Diagnosis not present

## 2019-04-28 DIAGNOSIS — N390 Urinary tract infection, site not specified: Secondary | ICD-10-CM | POA: Diagnosis not present

## 2019-04-29 ENCOUNTER — Ambulatory Visit: Payer: BC Managed Care – PPO

## 2019-04-30 ENCOUNTER — Ambulatory Visit: Payer: BC Managed Care – PPO

## 2019-05-01 ENCOUNTER — Ambulatory Visit: Payer: BC Managed Care – PPO | Attending: Internal Medicine

## 2019-05-01 DIAGNOSIS — Z23 Encounter for immunization: Secondary | ICD-10-CM

## 2019-05-01 NOTE — Progress Notes (Signed)
   Covid-19 Vaccination Clinic  Name:  KHALILAH HARDNETT    MRN: [id number] DOB: 1970/12/02  05/01/2019  Ms. Hindes was observed post Covid-19 immunization for 15 minutes without incident. She was provided with Vaccine Information Sheet and instruction to access the V-Safe system.   Ms. Voller was instructed to call 911 with any severe reactions post vaccine: Marland Kitchen Difficulty breathing  . Swelling of face and throat  . A fast heartbeat  . A bad rash all over body  . Dizziness and weakness   Immunizations Administered    Name Date Dose VIS Date Route   Moderna COVID-19 Vaccine 05/01/2019  8:06 AM 0.5 mL 01/12/2019 Intramuscular   Manufacturer: Levan Hurst   Lot: [id number]   [place]: [id number]      Covid-19 Vaccination Clinic  Name:  MAKALAH SAFFO    MRN: [id number] DOB: September 01, 1970  05/01/2019  Ms. Kopcho was observed post Covid-19 immunization for 15 minutes without incident. She was provided with Vaccine Information Sheet and instruction to access the V-Safe system.   Ms. Papka was instructed to call 911 with any severe reactions post vaccine: Marland Kitchen Difficulty breathing  . Swelling of face and throat  . A fast heartbeat  . A bad rash all over body  . Dizziness and weakness   Immunizations Administered    Name Date Dose VIS Date Route   Moderna COVID-19 Vaccine 05/01/2019  8:06 AM 0.5 mL 01/12/2019 Intramuscular   Manufacturer: Moderna   Lot: [id number]   [id number]

## 2019-05-05 DIAGNOSIS — T783XXS Angioneurotic edema, sequela: Secondary | ICD-10-CM | POA: Diagnosis not present

## 2019-05-05 DIAGNOSIS — Z13220 Encounter for screening for lipoid disorders: Secondary | ICD-10-CM | POA: Diagnosis not present

## 2019-05-05 DIAGNOSIS — J383 Other diseases of vocal cords: Secondary | ICD-10-CM | POA: Diagnosis not present

## 2019-05-05 DIAGNOSIS — E890 Postprocedural hypothyroidism: Secondary | ICD-10-CM | POA: Diagnosis not present

## 2019-05-05 DIAGNOSIS — Z1389 Encounter for screening for other disorder: Secondary | ICD-10-CM | POA: Diagnosis not present

## 2019-05-05 DIAGNOSIS — E782 Mixed hyperlipidemia: Secondary | ICD-10-CM | POA: Diagnosis not present

## 2019-05-05 DIAGNOSIS — Z6828 Body mass index (BMI) 28.0-28.9, adult: Secondary | ICD-10-CM | POA: Diagnosis not present

## 2019-06-02 ENCOUNTER — Ambulatory Visit: Payer: BC Managed Care – PPO | Attending: Internal Medicine

## 2019-06-02 DIAGNOSIS — Z23 Encounter for immunization: Secondary | ICD-10-CM

## 2019-06-02 NOTE — Progress Notes (Signed)
   Covid-19 Vaccination Clinic  Name:  JORDY CARONIA    MRN: [id number] DOB: February 09, 1971  06/02/2019  Ms. Gandolfo was observed post Covid-19 immunization for 15 minutes without incident. She was provided with Vaccine Information Sheet and instruction to access the V-Safe system.   Ms. Devany was instructed to call 911 with any severe reactions post vaccine: Marland Kitchen Difficulty breathing  . Swelling of face and throat  . A fast heartbeat  . A bad rash all over body  . Dizziness and weakness   Immunizations Administered    Name Date Dose VIS Date Route   Moderna COVID-19 Vaccine 06/02/2019  8:18 AM 0.5 mL 01/2019 Intramuscular   Manufacturer: Levan Hurst   Lot: [id number]   [place]: [id number]      Covid-19 Vaccination Clinic  Name:  QUANETTA SCHLAU    MRN: [id number] DOB: 1970/06/13  06/02/2019  Ms. Hardman was observed post Covid-19 immunization for 15 minutes without incident. She was provided with Vaccine Information Sheet and instruction to access the V-Safe system.   Ms. Hout was instructed to call 911 with any severe reactions post vaccine: Marland Kitchen Difficulty breathing  . Swelling of face and throat  . A fast heartbeat  . A bad rash all over body  . Dizziness and weakness   Immunizations Administered    Name Date Dose VIS Date Route   Moderna COVID-19 Vaccine 06/02/2019  8:18 AM 0.5 mL 01/2019 Intramuscular   Manufacturer: Moderna   Lot: [id number]   [id number]

## 2019-06-15 DIAGNOSIS — Z681 Body mass index (BMI) 19 or less, adult: Secondary | ICD-10-CM | POA: Diagnosis not present

## 2019-06-15 DIAGNOSIS — E039 Hypothyroidism, unspecified: Secondary | ICD-10-CM | POA: Diagnosis not present

## 2019-06-15 DIAGNOSIS — N390 Urinary tract infection, site not specified: Secondary | ICD-10-CM | POA: Diagnosis not present

## 2019-07-16 ENCOUNTER — Encounter: Payer: Self-pay | Admitting: Obstetrics and Gynecology

## 2019-07-16 ENCOUNTER — Ambulatory Visit (INDEPENDENT_AMBULATORY_CARE_PROVIDER_SITE_OTHER): Payer: BC Managed Care – PPO | Admitting: Obstetrics and Gynecology

## 2019-07-16 ENCOUNTER — Other Ambulatory Visit (HOSPITAL_COMMUNITY)
Admission: RE | Admit: 2019-07-16 | Discharge: 2019-07-16 | Disposition: A | Discharge status: Home / Self Care | Payer: BC Managed Care – PPO | Source: Ambulatory Visit | Attending: Obstetrics and Gynecology | Admitting: Obstetrics and Gynecology

## 2019-07-16 VITALS — BP 117/75 | HR 72 | Ht 67.0 in | Wt 185.0 lb

## 2019-07-16 DIAGNOSIS — D259 Leiomyoma of uterus, unspecified: Secondary | ICD-10-CM | POA: Diagnosis not present

## 2019-07-16 DIAGNOSIS — Z01419 Encounter for gynecological examination (general) (routine) without abnormal findings: Secondary | ICD-10-CM | POA: Diagnosis not present

## 2019-07-16 DIAGNOSIS — Z1272 Encounter for screening for malignant neoplasm of vagina: Secondary | ICD-10-CM

## 2019-07-16 DIAGNOSIS — N920 Excessive and frequent menstruation with regular cycle: Secondary | ICD-10-CM

## 2019-07-16 NOTE — Progress Notes (Signed)
Patient ID: Tiffany Underwood, female   DOB: 29-May-1970, 49 y.o.   MRN: [id number]   Assessment:  1. Annual Gyn Exam 2. Uterine Fibroids 12+wk 3. Menorrhagia Plan:  1. Pap smear done, next pap due 3 years if this one comes back normal 2. Return annually or prn 3.   Annual mammogram advised after age 49 4.   Recommended using NuvaRing continuously to avoid having periods I cannot add Lysteda, as it is not recommended in combinaiton with OCP use. Subjective:  Tiffany Underwood is a 49 y.o. female G1P1 who presents for annual exam. Patient's last menstrual period was 06/22/2019 (exact date). The patient has complaints today of heavy periods with clots. 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 She uses NuvaRing for contraception. She wears it for 3 weeks and then has a period. The patient is fully vaccinated for COVID-19.  The following portions of the patient's history were reviewed and updated as appropriate: allergies, current medications, past family history, past medical history, past social history, past surgical history and problem list. Past Medical History:  Diagnosis Date  . Angio-edema    Primary sent here to determine  . Fibroids   . Thyroid disease   . Urticaria     Past Surgical History:  Procedure Laterality Date  . UTERINE FIBROID SURGERY       Current Outpatient Medications:  .  ALPRAZolam (XANAX XR) 1 MG 24 hr tablet, alprazolam ER 1 mg tablet,extended release 24 hr, Disp: , Rfl:  .  cetirizine (ZYRTEC) 10 MG tablet, Take 10 mg by mouth daily., Disp: , Rfl:  .  diphenhydrAMINE (SOMINEX) 25 MG tablet, Take by mouth., Disp: , Rfl:  .  EPINEPHrine 0.3 mg/0.3 mL IJ SOAJ injection, Inject 0.3 mLs (0.3 mg total) into the muscle once. For severe allergic reaction with difficulty breathing or swallowing., Disp: 1 Device, Rfl: 1 .  etonogestrel-ethinyl estradiol (NUVARING) 0.12-0.015 MG/24HR vaginal ring, INSERT 1 RING VAGINALLY FOR 24 DAYS THEN REMOVE FOR 4 DAYS  AND INSERT NEW RING, Disp: 3 each, Rfl: 4 .  levothyroxine (SYNTHROID, LEVOTHROID) 125 MCG tablet, Take 125 mcg by mouth daily before breakfast., Disp: , Rfl:   Review of Systems Constitutional: negative Gastrointestinal: negative Genitourinary: negative  Objective:  BP 117/75 (BP Location: Right Arm, Patient Position: Sitting, Cuff Size: Normal)   Pulse 72   Ht 5\' 7"  (1.702 m)   Wt 185 lb (83.9 kg)   LMP 06/22/2019 (Exact Date)   BMI 28.98 kg/m    BMI: Body mass index is 28.98 kg/m.  General Appearance: Alert, appropriate appearance for age. No acute distress HEENT: Grossly normal Neck / Thyroid:  Cardiovascular: RRR; normal S1, S2, no murmur Lungs: CTA bilaterally Back: No CVAT Breast Exam: No dimpling, nipple retraction or discharge. No masses or nodes., Normal to inspection and Normal breast tissue bilaterally Gastrointestinal: Soft, non-tender, no masses or organomegaly Pelvic Exam: Vulva and vagina appear normal. Bimanual exam reveals normal uterus and adnexa.  Cervix is deviated to the patient's right. Pt has uterine fibroids. Rectovaginal: guaiac negative stool obtained Lymphatic Exam: Non-palpable nodes in neck, clavicular, axillary, or inguinal regions  Skin: no rash or abnormalities Neurologic: Normal gait and speech, no tremor  Psychiatric: Alert and oriented, appropriate affect.  Urinalysis:Not done  Mallory Shirk. MD Pgr [phone] 10:25 AM  By signing my name below, I, De Burrs, attest that this documentation has been prepared under the direction and in the presence of Jonnie Kind, MD.  Electronically Signed: De Burrs, Medical Scribe. 07/16/19. 10:25 AM.  I personally performed the services described in this documentation, which was SCRIBED in my presence. The recorded information has been reviewed and considered accurate. It has been edited as necessary during review. Jonnie Kind, MD

## 2019-07-20 LAB — CYTOLOGY - PAP
Comment: NEGATIVE
Diagnosis: NEGATIVE
High risk HPV: NEGATIVE

## 2019-07-25 NOTE — Progress Notes (Signed)
Normal pap ,may repeat in 3+ yrs.

## 2019-08-04 ENCOUNTER — Telehealth: Payer: Self-pay | Admitting: Obstetrics and Gynecology

## 2019-08-04 MED ORDER — ETONOGESTREL-ETHINYL ESTRADIOL 0.12-0.015 MG/24HR VA RING: VAGINAL_RING | VAGINAL | 4 refills | Status: DC

## 2019-08-04 NOTE — Telephone Encounter (Signed)
Tiffany Underwood called.  She wanted a way to avoid menses. Advised to use Nuvaring in continuous method, leaving in 28 days, then replacing the same day with new ring, within 4 hours of ring removal. Pt aware that Rx called in.

## 2019-08-04 NOTE — Telephone Encounter (Signed)
Telephoned patient at home number. Patient states prescription is not at pharmacy. Patient states Dr. Glo Herring was going to try new birth control for her. Advised physician not in office but would send message. Patient will check back next week. Patient voiced understanding.

## 2019-08-04 NOTE — Telephone Encounter (Signed)
I called [facility] and changed Nuvaring Rx to a continuous regimen with 1 yr refils, for menstrual control

## 2019-08-04 NOTE — Telephone Encounter (Signed)
Patient called in to see if her prescription was sent in.

## 2019-08-05 NOTE — Telephone Encounter (Signed)
Telephoned patient at home number and left message that Rx was sent to pharmacy.

## 2019-11-02 ENCOUNTER — Other Ambulatory Visit (HOSPITAL_COMMUNITY): Payer: Self-pay | Admitting: Family Medicine

## 2019-11-02 ENCOUNTER — Other Ambulatory Visit (HOSPITAL_COMMUNITY): Payer: Self-pay | Admitting: Obstetrics and Gynecology

## 2019-11-02 DIAGNOSIS — Z1231 Encounter for screening mammogram for malignant neoplasm of breast: Secondary | ICD-10-CM

## 2019-11-04 ENCOUNTER — Ambulatory Visit (HOSPITAL_COMMUNITY)
Admission: RE | Admit: 2019-11-04 | Discharge: 2019-11-04 | Disposition: A | Discharge status: Home / Self Care | Payer: BC Managed Care – PPO | Source: Ambulatory Visit | Attending: Obstetrics and Gynecology | Admitting: Obstetrics and Gynecology

## 2019-11-04 ENCOUNTER — Other Ambulatory Visit: Payer: Self-pay

## 2019-11-04 DIAGNOSIS — Z1231 Encounter for screening mammogram for malignant neoplasm of breast: Secondary | ICD-10-CM | POA: Diagnosis not present

## 2019-11-04 DIAGNOSIS — E039 Hypothyroidism, unspecified: Secondary | ICD-10-CM | POA: Diagnosis not present

## 2019-12-15 DIAGNOSIS — E890 Postprocedural hypothyroidism: Secondary | ICD-10-CM | POA: Diagnosis not present

## 2019-12-15 DIAGNOSIS — E039 Hypothyroidism, unspecified: Secondary | ICD-10-CM | POA: Diagnosis not present

## 2020-03-27 DIAGNOSIS — F419 Anxiety disorder, unspecified: Secondary | ICD-10-CM | POA: Diagnosis not present

## 2020-03-28 ENCOUNTER — Other Ambulatory Visit: Payer: BC Managed Care – PPO

## 2020-03-28 DIAGNOSIS — Z20822 Contact with and (suspected) exposure to covid-19: Secondary | ICD-10-CM | POA: Diagnosis not present

## 2020-03-29 LAB — NOVEL CORONAVIRUS, NAA: SARS-CoV-2, NAA: NOT DETECTED

## 2020-03-29 LAB — SARS-COV-2, NAA 2 DAY TAT

## 2020-04-18 DIAGNOSIS — Z0001 Encounter for general adult medical examination with abnormal findings: Secondary | ICD-10-CM | POA: Diagnosis not present

## 2020-04-18 DIAGNOSIS — E890 Postprocedural hypothyroidism: Secondary | ICD-10-CM | POA: Diagnosis not present

## 2020-04-18 DIAGNOSIS — T783XXS Angioneurotic edema, sequela: Secondary | ICD-10-CM | POA: Diagnosis not present

## 2020-04-18 DIAGNOSIS — J383 Other diseases of vocal cords: Secondary | ICD-10-CM | POA: Diagnosis not present

## 2020-04-18 DIAGNOSIS — E559 Vitamin D deficiency, unspecified: Secondary | ICD-10-CM | POA: Diagnosis not present

## 2020-04-18 DIAGNOSIS — Z1389 Encounter for screening for other disorder: Secondary | ICD-10-CM | POA: Diagnosis not present

## 2020-04-18 DIAGNOSIS — Z6830 Body mass index (BMI) 30.0-30.9, adult: Secondary | ICD-10-CM | POA: Diagnosis not present

## 2020-04-18 DIAGNOSIS — R001 Bradycardia, unspecified: Secondary | ICD-10-CM | POA: Diagnosis not present

## 2020-04-18 DIAGNOSIS — Z1331 Encounter for screening for depression: Secondary | ICD-10-CM | POA: Diagnosis not present

## 2020-04-25 ENCOUNTER — Telehealth: Payer: Self-pay | Admitting: Adult Health

## 2020-04-25 NOTE — Telephone Encounter (Addendum)
Pt is on NuvaRing. She has been doing continuous birth control since last year. Pt was bleeding heavily with ring in, so she removed it and continued for another week of bleeding. Had bleeding for a total of 2 weeks. Having hot flashes. She hasn't put ring back in. Has appt in June. Please advise. Thanks!! [place]

## 2020-04-25 NOTE — Telephone Encounter (Signed)
Put ring in today and wear for 25 days take out for 3 and then place new ring, has appt in June

## 2020-04-25 NOTE — Telephone Encounter (Signed)
Patient called stating that she has a question for [facility] or her nurse regarding her nuva ring. Patient states that she is having an issue with it. please contact pt

## 2020-07-17 ENCOUNTER — Other Ambulatory Visit: Payer: Self-pay

## 2020-07-17 ENCOUNTER — Ambulatory Visit (INDEPENDENT_AMBULATORY_CARE_PROVIDER_SITE_OTHER): Payer: BC Managed Care – PPO | Admitting: Adult Health

## 2020-07-17 ENCOUNTER — Encounter: Payer: Self-pay | Admitting: Adult Health

## 2020-07-17 VITALS — BP 116/73 | HR 71 | Ht 67.0 in | Wt 189.0 lb

## 2020-07-17 DIAGNOSIS — M545 Low back pain, unspecified: Secondary | ICD-10-CM

## 2020-07-17 DIAGNOSIS — Z01419 Encounter for gynecological examination (general) (routine) without abnormal findings: Secondary | ICD-10-CM | POA: Diagnosis not present

## 2020-07-17 DIAGNOSIS — Z3044 Encounter for surveillance of vaginal ring hormonal contraceptive device: Secondary | ICD-10-CM | POA: Diagnosis not present

## 2020-07-17 DIAGNOSIS — Z1211 Encounter for screening for malignant neoplasm of colon: Secondary | ICD-10-CM

## 2020-07-17 DIAGNOSIS — Z1212 Encounter for screening for malignant neoplasm of rectum: Secondary | ICD-10-CM

## 2020-07-17 LAB — HEMOCCULT GUIAC POC 1CARD (OFFICE): Fecal Occult Blood, POC: NEGATIVE

## 2020-07-17 MED ORDER — ETONOGESTREL-ETHINYL ESTRADIOL 0.12-0.015 MG/24HR VA RING: VAGINAL_RING | VAGINAL | 4 refills | Status: DC

## 2020-07-17 MED ORDER — CYCLOBENZAPRINE HCL 5 MG PO TABS: 5.0000 mg | ORAL_TABLET | Freq: Three times a day (TID) | ORAL | 0 refills | Status: DC | PRN

## 2020-07-17 NOTE — Progress Notes (Signed)
Patient ID: RAESHAWN Underwood, female   DOB: 11-05-1970, 50 y.o.   MRN: [id number] History of Present Illness:  Tiffany Underwood is a 50 year old black female,married, G1P1 in for [place] woman gyn exam. She is working from home. Lab Results  Component Value Date   DIAGPAP  07/16/2019    - Negative for intraepithelial lesion or malignancy (NILM)   HPV NOT DETECTED 12/03/2017   [place] Negative 07/16/2019  PCP is Dr Hilma Favors.  Current Medications, Allergies, Past Medical History, Past Surgical History, Family History and Social History were reviewed in [employer] record.     Review of Systems:  Patient denies any headaches, hearing loss, fatigue, blurred vision, shortness of breath, chest pain, abdominal pain, problems with bowel movements, urination, or intercourse. No joint pain or mood swings. Has low back pain on the left, it comes and goes and has for a bout a month, now. Doing good with ring   Physical Exam:BP 116/73 (BP Location: Right Arm, Patient Position: Sitting, Cuff Size: Normal)   Pulse 71   Ht 5\' 7"  (1.702 m)   Wt 189 lb (85.7 kg)   BMI 29.60 kg/m  General:  Well developed, well nourished, no acute distress Skin:  Warm and dry Neck:  Midline trachea, normal thyroid, good ROM, no lymphadenopathy Lungs; Clear to auscultation bilaterally Breast:  No dominant palpable mass, retraction, or nipple discharge Cardiovascular: Regular rate and rhythm Abdomen:  Soft, non tender, no hepatosplenomegaly Pelvic:  External genitalia is normal in appearance, no lesions.  The vagina is normal in appearance. Urethra has no lesions or masses. The cervix is bulbous.  Uterus is felt to be normal size, shape, and contour.  No adnexal masses or tenderness noted.Bladder is non tender, no masses felt. Rectal: Good sphincter tone, no polyps, or hemorrhoids felt.  Hemoccult negative. Extremities/musculoskeletal:  No swelling or varicosities noted, no clubbing or cyanosis Psych:  No  mood changes, alert and cooperative,seems happy AA is 1 Fall risk is low Depression screen [facility] 2/9 07/17/2020 07/16/2019 12/02/2016  Decreased Interest 0 0 0  Down, Depressed, Hopeless 0 0 0  PHQ - 2 Score 0 0 0  Altered sleeping 0 0 -  Tired, decreased energy 0 0 -  Change in appetite 0 0 -  Feeling bad or failure about yourself  0 0 -  Trouble concentrating 0 0 -  Moving slowly or fidgety/restless 0 0 -  Suicidal thoughts 0 0 -  PHQ-9 Score 0 0 -  Difficult doing work/chores - Not difficult at all -   GAD 7 : Generalized Anxiety Score 07/17/2020 07/16/2019  Nervous, Anxious, on Edge 0 0  Control/stop worrying 0 0  Worry too much - different things 0 0  Trouble relaxing 0 0  Restless 0 0  Easily annoyed or irritable 0 0  Afraid - awful might happen 0 0  Total GAD 7 Score 0 0  Anxiety Difficulty - Not difficult at all    Upstream - 07/17/20 0840      Pregnancy Intention Screening   Does the patient want to become pregnant in the next year? No    Does the patient's partner want to become pregnant in the next year? No    Would the patient like to discuss contraceptive options today? No      Contraception Wrap Up   Current Method Vaginal Ring    End Method Vaginal Ring    Contraception Counseling Provided No  Examination chaperoned by Celene Squibb LPN  Impression and Plan: 1. Encounter for well woman exam with routine gynecological exam Physical in 1 year Pap 2024 Mammogram yearly  2. Encounter for screening fecal occult blood testing  3. Screening for colorectal cancer Referred for colonoscopy  - Ambulatory referral to Gastroenterology  4. Acute left-sided low back pain without sciatica Will rx flexeril and alternate ice and heat, if not better follow up with PCP  Meds ordered this encounter  Medications  . etonogestrel-ethinyl estradiol (NUVARING) 0.12-0.015 MG/24HR vaginal ring    Sig: INSERT 1 RING VAGINALLY FOR 28 AND INSERT NEW RING the same day as  removal (continuous use regimen)    Dispense:  3 each    Refill:  4    Order Specific Question:   Supervising Provider    Answer:   Elonda Husky, LUTHER H [[id number]]  . cyclobenzaprine (FLEXERIL) 5 MG tablet    Sig: Take 1 tablet (5 mg total) by mouth 3 (three) times daily as needed for muscle spasms.    Dispense:  30 tablet    Refill:  0    Order Specific Question:   Supervising Provider    Answer:   Elonda Husky, LUTHER H [[id number]]    5. Encounter for surveillance of vaginal ring hormonal contraceptive device Refilled Nuva Ring

## 2020-07-21 ENCOUNTER — Encounter: Payer: Self-pay | Admitting: *Deleted

## 2020-08-23 ENCOUNTER — Ambulatory Visit: Payer: BC Managed Care – PPO

## 2020-08-29 ENCOUNTER — Other Ambulatory Visit: Payer: Self-pay

## 2020-08-29 ENCOUNTER — Ambulatory Visit (INDEPENDENT_AMBULATORY_CARE_PROVIDER_SITE_OTHER): Payer: Self-pay | Admitting: *Deleted

## 2020-08-29 VITALS — Ht 67.0 in | Wt 192.4 lb

## 2020-08-29 DIAGNOSIS — Z1211 Encounter for screening for malignant neoplasm of colon: Secondary | ICD-10-CM

## 2020-08-29 MED ORDER — CLENPIQ 10-3.5-12 MG-GM -GM/160ML PO SOLN: 1.0000 | Freq: Once | ORAL | 0 refills | Status: AC

## 2020-08-29 NOTE — Patient Instructions (Signed)
[facility]   Patient Name:  Tiffany Underwood Date of procedure:  09/13/2020 Time to register at [place] Stay:  6:30 am Provider:  Dr. Gala Romney  Please notify us immediately if you are diabetic, take iron supplements, or if you are on coumadin or any blood thinners.  Please hold the following medications: n/a  Note: Do NOT refrigerate or freeze CLENPIQ. CLENPIQ is ready to drink. There is no need to add any other liquid or mix the medicine in the bottle before you start dosing.   09/12/2020-  1 Day prior to procedure:    CLEAR LIQUIDS ALL DAY--NO SOLID FOODS OR DAIRY PRODUCTS! See list of liquids that are allowed and items that are NOT allowed below.  Diabetic Medication Instructions:  n/a  You must drink plenty of CLEAR LIQUIDS starting before your bowel prep. It is important to stay adequately hydrated before, during, and after your bowel prep for the prep to work effectively!  At 4:00 PM Begin the prep as follows:    Drink one bottle of pre-mixed CLENPIQ right from the bottle.  Drink at least five (5) 8-ounce drinks of clear liquids of your choice within the next 5 hours  At 10:00 PM: Drink the second bottle of pre-mixed CLENPIQ right from the bottle.   Drink at least three (3) 8-ounce drinks of clear liquids of your choice within the next 3 hours before going to bed.  Continue clear liquids.    09/13/2020-  Day of Procedure:  Diabetic medications adjustments: n/a  You may take TYLENOL products.  Please continue your regular medications unless we have instructed you otherwise.    At 3 hours before procedure @ 4:30 am: Stop drinking all liquids, nothing by mouth at this point.   Please note, on the day of your procedure you MUST be accompanied by an adult who is willing to assume responsibility for you at time of discharge. If you do not have such person with you, your procedure will have to be rescheduled.                                                                                                                      Please leave ALL jewelry at home prior to coming to the hospital for your procedure.   *It is your responsibility to check with your insurance company for the benefits of coverage you have for this procedure. Unfortunately, not all insurance companies have benefits to cover all or part of these types of procedures. It is your responsibility to check your benefits, however we will be glad to assist you with any codes your insurance company may need.   Please note that most insurance companies will not cover a screening colonoscopy for people under the age of 73  For example, with some insurance companies you may have benefits for a screening colonoscopy, but if polyps are found the diagnosis will change and then you may have a deductible that will need to be met.  Please make sure you check your benefits for screening colonoscopy as well as a diagnostic colonoscopy.    CLEAR LIQUIDS: (NO RED or PURPLE) Water  Jello   Apple Juice  White Grape Juice   Kool-Aid Soft drinks  Banana popsicles Sports Drink  Black coffee (No cream or milk) Tea (No cream or milk)  Broth (fat free beef/chicken/vegetable)  Clear liquids allow you to see your fingers on the other side of the glass.  Be sure they are NOT RED or PURPLE in color, cloudy, but CLEAR.  Do Not Eat: Dairy products of any kind Cranberry juice Tomato or V8 Juice  Orange Juice   Grapefruit Juice Red Grape Juice Alcohol   Non-dairy creamer Solid foods like cereal, oatmeal, yogurt, fruits, vegetables, creamed soups, eggs, bread, etc   HELPFUL HINTS TO MAKE DRINKING EASIER: -Trying drinking through a straw. -If you become nauseated, try consuming smaller amounts or stretch out the time between glasses.  Stop for 30 minutes & slowly start back drinking.  Call our office with any questions or concerns at [phone].  Thank You,  Christ Kick, [place]

## 2020-08-29 NOTE — Progress Notes (Addendum)
Gastroenterology Pre-Procedure Review  Request Date: 08/29/2020 Requesting Physician: Derrek Monaco, NP @ [facility], Last TCS 03/23/2042 done by Dr Deatra Ina, normal colon  PATIENT REVIEW QUESTIONS: The patient responded to the following health history questions as indicated:    1. Diabetes Melitis: no 2. Joint replacements in the past 12 months: no 3. Major health problems in the past 3 months: no 4. Has an artificial valve or MVP: no 5. Has a defibrillator: no 6. Has been advised in past to take antibiotics in advance of a procedure like teeth cleaning: no 7. Family history of colon cancer: no  8. Alcohol Use: no 9. Illicit drug Use: no 10. History of sleep apnea: no  11. History of coronary artery or other vascular stents placed within the last 12 months: no 12. History of any prior anesthesia complications: no 13. Body mass index is 30.13 kg/m.    MEDICATIONS & ALLERGIES:    Patient reports the following regarding taking any blood thinners:   Plavix? no Aspirin? no Coumadin? no Brilinta? no Xarelto? no Eliquis? no Pradaxa? no Savaysa? no Effient? no  Patient confirms/reports the following medications:  Current Outpatient Medications  Medication Sig Dispense Refill   ALPRAZolam (XANAX XR) 1 MG 24 hr tablet daily.     cyclobenzaprine (FLEXERIL) 5 MG tablet Take 1 tablet (5 mg total) by mouth 3 (three) times daily as needed for muscle spasms. (Patient taking differently: Take 5 mg by mouth as needed for muscle spasms.) 30 tablet 0   EPINEPHrine 0.3 mg/0.3 mL IJ SOAJ injection Inject 0.3 mLs (0.3 mg total) into the muscle once. For severe allergic reaction with difficulty breathing or swallowing. 1 Device 1   etonogestrel-ethinyl estradiol (NUVARING) 0.12-0.015 MG/24HR vaginal ring INSERT 1 RING VAGINALLY FOR 28 AND INSERT NEW RING the same day as removal (continuous use regimen) 3 each 4   levothyroxine (SYNTHROID, LEVOTHROID) 125 MCG tablet Take 125 mcg by mouth  daily before breakfast.     No current facility-administered medications for this visit.    Patient confirms/reports the following allergies:  Allergies  Allergen Reactions   Nitrofuran Derivatives Rash   Sulfa Antibiotics Rash    No orders of the defined types were placed in this encounter.   AUTHORIZATION INFORMATION Primary Insurance: [place],  [place] #: [id number],  Group #: [id number] Pre-Cert / Josem Kaufmann required: No, not required per Rushie Goltz / Auth #: Ref#: [id number]  SCHEDULE INFORMATION: Procedure has been scheduled as follows:  Date: 09/13/2020, Time: 7:30  Location: [facility] with Dr. Gala Romney  This Gastroenterology Pre-Precedure Review Form is being routed to the following provider(s): Neil Crouch, PA-C

## 2020-08-30 NOTE — Progress Notes (Signed)
Patient should get propofol due to daily Xanax use.  Can she be switched to propofol ASA II without needing an ov?

## 2020-08-31 NOTE — Progress Notes (Addendum)
Called pt and informed her that she would need propofol for sedation and rescheduled her procedure to 09/19/2020 at 10:00 with arrival at 8:30 with Dr. Abbey Chatters.  Pt aware that she needs urine preg test done on 09/15/2020.  Pt voiced understanding.  Mailing out new instructions.

## 2020-09-01 ENCOUNTER — Other Ambulatory Visit: Payer: Self-pay | Admitting: *Deleted

## 2020-09-01 DIAGNOSIS — Z1211 Encounter for screening for malignant neoplasm of colon: Secondary | ICD-10-CM

## 2020-09-01 NOTE — Progress Notes (Signed)
Spoke to [place] at [facility].  No pre-cert required and colonoscopies are covered at age 50.  Ref#: [id number]

## 2020-09-15 ENCOUNTER — Other Ambulatory Visit (HOSPITAL_COMMUNITY)
Admission: RE | Admit: 2020-09-15 | Discharge: 2020-09-15 | Disposition: A | Discharge status: Home / Self Care | Payer: BC Managed Care – PPO | Source: Ambulatory Visit | Attending: Internal Medicine | Admitting: Internal Medicine

## 2020-09-15 ENCOUNTER — Other Ambulatory Visit: Payer: Self-pay

## 2020-09-15 DIAGNOSIS — Z1211 Encounter for screening for malignant neoplasm of colon: Secondary | ICD-10-CM | POA: Diagnosis not present

## 2020-09-15 LAB — PREGNANCY, URINE: Preg Test, Ur: NEGATIVE

## 2020-09-19 ENCOUNTER — Ambulatory Visit (HOSPITAL_COMMUNITY): Payer: BC Managed Care – PPO | Admitting: Anesthesiology

## 2020-09-19 ENCOUNTER — Ambulatory Visit (HOSPITAL_COMMUNITY)
Admission: RE | Admit: 2020-09-19 | Discharge: 2020-09-19 | Disposition: A | Discharge status: Home / Self Care | Payer: BC Managed Care – PPO | Attending: Internal Medicine | Admitting: Internal Medicine

## 2020-09-19 ENCOUNTER — Encounter (HOSPITAL_COMMUNITY): Payer: Self-pay

## 2020-09-19 ENCOUNTER — Encounter (HOSPITAL_COMMUNITY)
Admission: RE | Disposition: A | Discharge status: Home / Self Care | Payer: Self-pay | Source: Home / Self Care | Attending: Internal Medicine

## 2020-09-19 ENCOUNTER — Other Ambulatory Visit: Payer: Self-pay

## 2020-09-19 DIAGNOSIS — Z881 Allergy status to other antibiotic agents status: Secondary | ICD-10-CM | POA: Insufficient documentation

## 2020-09-19 DIAGNOSIS — Z79890 Hormone replacement therapy (postmenopausal): Secondary | ICD-10-CM | POA: Insufficient documentation

## 2020-09-19 DIAGNOSIS — Z882 Allergy status to sulfonamides status: Secondary | ICD-10-CM | POA: Diagnosis not present

## 2020-09-19 DIAGNOSIS — Z1211 Encounter for screening for malignant neoplasm of colon: Secondary | ICD-10-CM | POA: Diagnosis not present

## 2020-09-19 DIAGNOSIS — E039 Hypothyroidism, unspecified: Secondary | ICD-10-CM | POA: Diagnosis not present

## 2020-09-19 DIAGNOSIS — Z793 Long term (current) use of hormonal contraceptives: Secondary | ICD-10-CM | POA: Diagnosis not present

## 2020-09-19 DIAGNOSIS — Z79899 Other long term (current) drug therapy: Secondary | ICD-10-CM | POA: Diagnosis not present

## 2020-09-19 HISTORY — PX: COLONOSCOPY WITH PROPOFOL: SHX5780

## 2020-09-19 SURGERY — COLONOSCOPY WITH PROPOFOL
Anesthesia: Monitor Anesthesia Care

## 2020-09-19 MED ORDER — PROPOFOL 500 MG/50ML IV EMUL
INTRAVENOUS | Status: DC | PRN
  Administered 2020-09-19: 150 ug/kg/min via INTRAVENOUS

## 2020-09-19 MED ORDER — LACTATED RINGERS IV SOLN: INTRAVENOUS | Status: DC

## 2020-09-19 MED ORDER — PROPOFOL 10 MG/ML IV BOLUS
INTRAVENOUS | Status: DC | PRN
  Administered 2020-09-19: 100 mg via INTRAVENOUS

## 2020-09-19 NOTE — Op Note (Signed)
[facility] Patient Name: Tiffany Underwood Procedure Date: 09/19/2020 9:44 AM MRN: [id number] Date of Birth: 06-22-1970 Attending MD: Elon Alas. Abbey Chatters DO CSN: [id number] Age: 50 Admit Type: Outpatient Procedure:                Colonoscopy Indications:              Screening for colorectal malignant neoplasm Providers:                Elon Alas. Abbey Chatters, DO, Gwenlyn Fudge, RN, Randa Spike, Technician Referring MD:              Medicines:                See the Anesthesia note for documentation of the                            administered medications Complications:            No immediate complications. Estimated Blood Loss:     Estimated blood loss: none. Procedure:                Pre-Anesthesia Assessment:                           - The anesthesia plan was to use monitored                            anesthesia care (MAC).                           After obtaining informed consent, the colonoscope                            was passed under direct vision. Throughout the                            procedure, the patient's blood pressure, pulse, and                            oxygen saturations were monitored continuously. The                            [id number]-HQ190L ([id number]) scope was introduced through                            the anus and advanced to the the cecum, identified                            by appendiceal orifice and ileocecal valve. The                            colonoscopy was performed without difficulty. The                            patient tolerated the procedure well. The quality  of the bowel preparation was evaluated using the                            BBPS [facility] Bowel Preparation Scale) with scores                            of: Right Colon = 3, Transverse Colon = 3 and Left                            Colon = 3 (entire mucosa seen well with no residual                            staining, small  fragments of stool or opaque                            liquid). The total BBPS score equals 9. Scope In: 10:07:24 AM Scope Out: 10:17:25 AM Scope Withdrawal Time: 0 hours 6 minutes 5 seconds  Total Procedure Duration: 0 hours 10 minutes 1 second  Findings:      The perianal and digital rectal examinations were normal.      The entire examined colon appeared normal. Impression:               - The entire examined colon is normal.                           - No specimens collected. Moderate Sedation:      Per Anesthesia Care Recommendation:           - Patient has a contact number available for                            emergencies. The signs and symptoms of potential                            delayed complications were discussed with the                            patient. Return to normal activities tomorrow.                            Written discharge instructions were provided to the                            patient.                           - Resume previous diet.                           - Continue present medications.                           - Repeat colonoscopy in 10 years for screening  purposes.                           - Return to GI clinic PRN. Procedure Code(s):        --- Professional ---                           F5825, Colorectal cancer screening; colonoscopy on                            individual not meeting criteria for high risk Diagnosis Code(s):        --- Professional ---                           Z12.11, Encounter for screening for malignant                            neoplasm of colon CPT copyright 2019 American Medical Association. All rights reserved. The codes documented in this report are preliminary and upon coder review may  be revised to meet current compliance requirements. Elon Alas. Abbey Chatters, DO [place] Abbey Chatters, DO 09/19/2020 10:19:02 AM This report has been signed electronically. Number of Addenda: 0

## 2020-09-19 NOTE — H&P (Signed)
Primary Care Physician:  Sharilyn Sites, MD Primary Gastroenterologist:  Dr. Abbey Chatters  Pre-Procedure History & Physical: HPI:  Tiffany Underwood is a 50 y.o. female is here for a colonoscopy for colon cancer screening purposes.  Patient denies any family history of colorectal cancer.  No melena or hematochezia.  No abdominal pain or unintentional weight loss.  No change in bowel habits.  Overall feels well from a GI standpoint.  Past Medical History:  Diagnosis Date   Angio-edema    Primary sent here to determine   Fibroids    Thyroid disease    Urticaria     Past Surgical History:  Procedure Laterality Date   UTERINE FIBROID SURGERY      Prior to Admission medications   Medication Sig Start Date End Date Taking? Authorizing Provider  ALPRAZolam (XANAX XR) 1 MG 24 hr tablet Take 1 mg by mouth in the morning.   Yes [provider]  clobetasol ointment (TEMOVATE) [id number] % Apply 1 application topically 2 (two) times daily as needed (skin reaction (sun exposure)).   Yes [provider]  cyclobenzaprine (FLEXERIL) 5 MG tablet Take 1 tablet (5 mg total) by mouth 3 (three) times daily as needed for muscle spasms. 07/17/20  Yes [place] Dooms, NP  etonogestrel-ethinyl estradiol (NUVARING) 0.12-0.015 MG/24HR vaginal ring INSERT 1 RING VAGINALLY FOR 28 AND INSERT NEW RING the same day as removal (continuous use regimen) Patient taking differently: Place 1 each vaginally every 28 (twenty-eight) days. INSERT 1 RING VAGINALLY FOR 28 AND INSERT NEW RING the same day as removal (continuous use regimen) 07/17/20  Yes Derrek Monaco A, NP  levothyroxine (SYNTHROID, LEVOTHROID) 125 MCG tablet Take 125 mcg by mouth daily before breakfast.   Yes [provider]  CLENPIQ 10-3.5-12 MG-GM -GM/160ML SOLN Take 320 mLs by mouth as directed. 08/29/20   [provider]  EPINEPHrine 0.3 mg/0.3 mL IJ SOAJ injection Inject 0.3 mLs (0.3 mg total) into the muscle once. For severe  allergic reaction with difficulty breathing or swallowing. Patient taking differently: Inject 0.3 mg into the muscle as needed for anaphylaxis. For severe allergic reaction with difficulty breathing or swallowing. 01/28/15   Rancour, Annie Main, MD  diphenhydrAMINE (BENADRYL) 25 MG tablet Take 1 tablet (25 mg total) by mouth every 6 (six) hours. 01/28/15 01/12/19  Ezequiel Essex, MD    Allergies as of 09/01/2020 - Review Complete 08/29/2020  Allergen Reaction Noted   Nitrofuran derivatives Rash 07/16/2013   Sulfa antibiotics Rash 10/07/2012    Family History  Problem Relation Age of Onset   Cancer Mother 66       breast   Cancer Maternal Grandmother        breast    Social History   Socioeconomic History   Marital status: Married    Spouse name: Not on file   Number of children: Not on file   Years of education: Not on file   Highest education level: Not on file  Occupational History   Not on file  Tobacco Use   Smoking status: Never   Smokeless tobacco: Never  Vaping Use   Vaping Use: Never used  Substance and Sexual Activity   Alcohol use: No   Drug use: No   Sexual activity: Yes    Birth control/protection: Inserts  Other Topics Concern   Not on file  Social History Narrative   Not on file   Social Determinants of Health   Financial Resource Strain: Low Risk    Difficulty  of Paying Living Expenses: Not hard at all  Food Insecurity: No Food Insecurity   Worried About [place] in the Last Year: Never true   [place] in the Last Year: Never true  Transportation Needs: No Transportation Needs   Lack of Transportation (Medical): No   Lack of Transportation (Non-Medical): No  Physical Activity: Insufficiently Active   Days of Exercise per Week: 2 days   Minutes of Exercise per Session: 30 min  Stress: Stress Concern Present   Feeling of Stress : To some extent  Social Connections: Engineer, building services of Communication with Friends  and Family: More than three times a week   Frequency of Social Gatherings with Friends and Family: Once a week   Attends Religious Services: More than 4 times per year   Active Member of [employer] or Organizations: Yes   Attends Music therapist: More than 4 times per year   Marital Status: Married  Human resources officer Violence: Not At Risk   Fear of Current or Ex-Partner: No   Emotionally Abused: No   Physically Abused: No   Sexually Abused: No    Review of Systems: See HPI, otherwise negative ROS  Physical Exam: Vital signs in last 24 hours: Temp:  [98.3 F (36.8 C)] 98.3 F (36.8 C) (08/09 0845) Pulse Rate:  [78] 78 (08/09 0845) Resp:  [12] 12 (08/09 0845) BP: (136)/(96) 136/96 (08/09 0845) SpO2:  [100 %] 100 % (08/09 0845) Weight:  [86.2 kg] 86.2 kg (08/09 0835)   General:   Alert,  Well-developed, well-nourished, pleasant and cooperative in NAD Head:  Normocephalic and atraumatic. Eyes:  Sclera clear, no icterus.   Conjunctiva pink. Ears:  Normal auditory acuity. Nose:  No deformity, discharge,  or lesions. Mouth:  No deformity or lesions, dentition normal. Neck:  Supple; no masses or thyromegaly. Lungs:  Clear throughout to auscultation.   No wheezes, crackles, or rhonchi. No acute distress. Heart:  Regular rate and rhythm; no murmurs, clicks, rubs,  or gallops. Abdomen:  Soft, nontender and nondistended. No masses, hepatosplenomegaly or hernias noted. Normal bowel sounds, without guarding, and without rebound.   Msk:  Symmetrical without gross deformities. Normal posture. Extremities:  Without clubbing or edema. Neurologic:  Alert and  oriented x4;  grossly normal neurologically. Skin:  Intact without significant lesions or rashes. Cervical Nodes:  No significant cervical adenopathy. Psych:  Alert and cooperative. Normal mood and affect.  Impression/Plan: Tiffany Underwood is here for a colonoscopy to be performed for colon cancer screening purposes.  The  risks of the procedure including infection, bleed, or perforation as well as benefits, limitations, alternatives and imponderables have been reviewed with the patient. Questions have been answered. All parties agreeable.

## 2020-09-19 NOTE — Anesthesia Preprocedure Evaluation (Signed)
Anesthesia Evaluation  Patient identified by MRN, date of birth, ID band Patient awake    Reviewed: Allergy & Precautions, NPO status , Patient's Chart, lab work & pertinent test results  Airway Mallampati: II  TM Distance: >3 FB Neck ROM: Full    Dental no notable dental hx.    Pulmonary neg pulmonary ROS,    Pulmonary exam normal breath sounds clear to auscultation       Cardiovascular negative cardio ROS Normal cardiovascular exam Rhythm:Regular Rate:Normal     Neuro/Psych negative neurological ROS  negative psych ROS   GI/Hepatic negative GI ROS, Neg liver ROS,   Endo/Other  Hypothyroidism   Renal/GU negative Renal ROS  negative genitourinary   Musculoskeletal negative musculoskeletal ROS (+)   Abdominal   Peds negative pediatric ROS (+)  Hematology negative hematology ROS (+)   Anesthesia Other Findings H/O Angioedema Screening for colorectal cancer  Reproductive/Obstetrics negative OB ROS                             Anesthesia Physical Anesthesia Plan  ASA: 2  Anesthesia Plan: MAC   Post-op Pain Management:    Induction: Intravenous  PONV Risk Score and Plan:   Airway Management Planned: Nasal Cannula, Simple Face Mask and Natural Airway  Additional Equipment:   Intra-op Plan:   Post-operative Plan:   Informed Consent: I have reviewed the patients History and Physical, chart, labs and discussed the procedure including the risks, benefits and alternatives for the proposed anesthesia with the patient or authorized representative who has indicated his/her understanding and acceptance.       Plan Discussed with: CRNA  Anesthesia Plan Comments:         Anesthesia Quick Evaluation

## 2020-09-19 NOTE — Transfer of Care (Signed)
Immediate Anesthesia Transfer of Care Note  Patient: Tiffany Underwood  Procedure(s) Performed: COLONOSCOPY WITH PROPOFOL  Patient Location: Endoscopy Unit  Anesthesia Type:General  Level of Consciousness: awake, alert  and oriented  Airway & Oxygen Therapy: Patient Spontanous Breathing  Post-op Assessment: Report given to RN and Post -op Vital signs reviewed and stable  Post vital signs: Reviewed and stable  Last Vitals:  Vitals Value Taken Time  BP    Temp    Pulse    Resp    SpO2      Last Pain:  Vitals:   09/19/20 0845  TempSrc: Oral  PainSc:       Patients Stated Pain Goal: 10 (0000000 [id number])  Complications: No notable events documented.

## 2020-09-19 NOTE — Discharge Instructions (Addendum)

## 2020-09-19 NOTE — Anesthesia Postprocedure Evaluation (Signed)
Anesthesia Post Note  Patient: LIZETT CHOWNING  Procedure(s) Performed: COLONOSCOPY WITH PROPOFOL  Patient location during evaluation: PACU Anesthesia Type: MAC Level of consciousness: awake and alert Pain management: pain level controlled Vital Signs Assessment: post-procedure vital signs reviewed and stable Respiratory status: spontaneous breathing, nonlabored ventilation, respiratory function stable and patient connected to nasal cannula oxygen Cardiovascular status: blood pressure returned to baseline and stable Postop Assessment: no apparent nausea or vomiting Anesthetic complications: no   No notable events documented.   Last Vitals:  Vitals:   09/19/20 0845 09/19/20 1020  BP: (!) 136/96 116/76  Pulse: 78   Resp: 12 (!) 26  Temp: 36.8 C 36.7 C  SpO2: 100% 99%    Last Pain:  Vitals:   09/19/20 1020  TempSrc: Oral  PainSc: 0-No pain                 Nicanor Alcon

## 2020-09-20 ENCOUNTER — Other Ambulatory Visit (HOSPITAL_COMMUNITY): Payer: Self-pay | Admitting: Family Medicine

## 2020-09-20 DIAGNOSIS — Z1231 Encounter for screening mammogram for malignant neoplasm of breast: Secondary | ICD-10-CM

## 2020-09-27 ENCOUNTER — Encounter (HOSPITAL_COMMUNITY): Payer: Self-pay | Admitting: Internal Medicine

## 2020-10-03 DIAGNOSIS — F419 Anxiety disorder, unspecified: Secondary | ICD-10-CM | POA: Diagnosis not present

## 2020-10-03 DIAGNOSIS — E063 Autoimmune thyroiditis: Secondary | ICD-10-CM | POA: Diagnosis not present

## 2020-11-06 ENCOUNTER — Ambulatory Visit (HOSPITAL_COMMUNITY)
Admission: RE | Admit: 2020-11-06 | Discharge: 2020-11-06 | Disposition: A | Discharge status: Home / Self Care | Payer: BC Managed Care – PPO | Source: Ambulatory Visit | Attending: Family Medicine | Admitting: Family Medicine

## 2020-11-06 ENCOUNTER — Other Ambulatory Visit: Payer: Self-pay

## 2020-11-06 DIAGNOSIS — Z1231 Encounter for screening mammogram for malignant neoplasm of breast: Secondary | ICD-10-CM | POA: Insufficient documentation

## 2020-12-26 ENCOUNTER — Emergency Department (HOSPITAL_COMMUNITY)
Admission: EM | Admit: 2020-12-26 | Discharge: 2020-12-26 | Disposition: A | Discharge status: Home / Self Care | Payer: BC Managed Care – PPO | Attending: Emergency Medicine | Admitting: Emergency Medicine

## 2020-12-26 ENCOUNTER — Other Ambulatory Visit: Payer: Self-pay

## 2020-12-26 ENCOUNTER — Encounter (HOSPITAL_COMMUNITY): Payer: Self-pay | Admitting: *Deleted

## 2020-12-26 ENCOUNTER — Emergency Department (HOSPITAL_COMMUNITY): Payer: BC Managed Care – PPO

## 2020-12-26 DIAGNOSIS — R0789 Other chest pain: Secondary | ICD-10-CM | POA: Diagnosis not present

## 2020-12-26 DIAGNOSIS — R079 Chest pain, unspecified: Secondary | ICD-10-CM | POA: Diagnosis not present

## 2020-12-26 DIAGNOSIS — I10 Essential (primary) hypertension: Secondary | ICD-10-CM | POA: Diagnosis not present

## 2020-12-26 DIAGNOSIS — F419 Anxiety disorder, unspecified: Secondary | ICD-10-CM | POA: Diagnosis not present

## 2020-12-26 DIAGNOSIS — J040 Acute laryngitis: Secondary | ICD-10-CM | POA: Insufficient documentation

## 2020-12-26 DIAGNOSIS — E6609 Other obesity due to excess calories: Secondary | ICD-10-CM | POA: Diagnosis not present

## 2020-12-26 DIAGNOSIS — Z20822 Contact with and (suspected) exposure to covid-19: Secondary | ICD-10-CM | POA: Insufficient documentation

## 2020-12-26 DIAGNOSIS — E890 Postprocedural hypothyroidism: Secondary | ICD-10-CM | POA: Diagnosis not present

## 2020-12-26 DIAGNOSIS — T783XXS Angioneurotic edema, sequela: Secondary | ICD-10-CM | POA: Diagnosis not present

## 2020-12-26 DIAGNOSIS — Z79899 Other long term (current) drug therapy: Secondary | ICD-10-CM | POA: Insufficient documentation

## 2020-12-26 DIAGNOSIS — Z6831 Body mass index (BMI) 31.0-31.9, adult: Secondary | ICD-10-CM | POA: Diagnosis not present

## 2020-12-26 LAB — CBC WITH DIFFERENTIAL/PLATELET
Abs Immature Granulocytes: 0.01 10*3/uL (ref 0.00–0.07)
Basophils Absolute: 0.1 10*3/uL (ref 0.0–0.1)
Basophils Relative: 1 %
Eosinophils Absolute: 0.2 10*3/uL (ref 0.0–0.5)
Eosinophils Relative: 2 %
HCT: 40.8 % (ref 36.0–46.0)
Hemoglobin: 13.3 g/dL (ref 12.0–15.0)
Immature Granulocytes: 0 %
Lymphocytes Relative: 28 %
Lymphs Abs: 1.9 10*3/uL (ref 0.7–4.0)
MCH: 29.8 pg (ref 26.0–34.0)
MCHC: 32.6 g/dL (ref 30.0–36.0)
MCV: 91.5 fL (ref 80.0–100.0)
Monocytes Absolute: 0.5 10*3/uL (ref 0.1–1.0)
Monocytes Relative: 7 %
Neutro Abs: 4.2 10*3/uL (ref 1.7–7.7)
Neutrophils Relative %: 62 %
Platelets: 218 10*3/uL (ref 150–400)
RBC: 4.46 MIL/uL (ref 3.87–5.11)
RDW: 14 % (ref 11.5–15.5)
WBC: 6.9 10*3/uL (ref 4.0–10.5)
nRBC: 0 % (ref 0.0–0.2)

## 2020-12-26 LAB — BASIC METABOLIC PANEL
Anion gap: 7 (ref 5–15)
BUN: 14 mg/dL (ref 6–20)
CO2: 21 mmol/L — ABNORMAL LOW (ref 22–32)
Calcium: 9.1 mg/dL (ref 8.9–10.3)
Chloride: 110 mmol/L (ref 98–111)
Creatinine, Ser: 0.91 mg/dL (ref 0.44–1.00)
GFR, Estimated: >60 mL/min (ref >60)
Glucose, Bld: 85 mg/dL (ref 70–99)
Potassium: 4.6 mmol/L (ref 3.5–5.1)
Sodium: 138 mmol/L (ref 135–145)

## 2020-12-26 LAB — RESP PANEL BY RT-PCR (FLU A&B, COVID) ARPGX2
Influenza A by PCR: NEGATIVE
Influenza B by PCR: NEGATIVE
SARS Coronavirus 2 by RT PCR: NEGATIVE

## 2020-12-26 LAB — TROPONIN I (HIGH SENSITIVITY): Troponin I (High Sensitivity): <2 ng/L (ref <18)

## 2020-12-26 MED ORDER — PREDNISONE 20 MG PO TABS
40.0000 mg | ORAL_TABLET | Freq: Once | ORAL | Status: AC
  Administered 2020-12-26: 40 mg via ORAL
  Filled 2020-12-26: qty 2

## 2020-12-26 MED ORDER — NAPROXEN 500 MG PO TABS: 500.0000 mg | ORAL_TABLET | Freq: Two times a day (BID) | ORAL | 0 refills | Status: DC

## 2020-12-26 MED ORDER — PREDNISONE 20 MG PO TABS: 40.0000 mg | ORAL_TABLET | Freq: Every day | ORAL | 0 refills | Status: DC

## 2020-12-26 MED ORDER — LIDOCAINE VISCOUS HCL 2 % MT SOLN
15.0000 mL | Freq: Once | OROMUCOSAL | Status: AC
  Administered 2020-12-26: 15 mL via OROMUCOSAL
  Filled 2020-12-26: qty 15

## 2020-12-26 NOTE — Discharge Instructions (Signed)
Your testing shows no signs of COVID, no signs of the flu or RSV.  I suspect that you do have laryngitis from a virus, read the attached instructions  Prednisone is a steroid that helps to reduce certain types of inflammation and may be used for allergic reactions, some rashes such as poison ivy or dermatitis, for asthma attacks or bronchitis and for certain types of pain.  Please take this medicine exactly as prescribed - 40mg  by mouth daily for 5 days.  This can have certain side effects with some people including feeling like you can't sleep, feeling anxious or feeling like you are on a "high".  It should not cause weight gain if only taken for a short time.    Please take Naprosyn, 500mg  by mouth twice daily as needed for pain - this in an antiinflammatory medicine (NSAID) and is similar to ibuprofen - many people feel that it is stronger than ibuprofen and it is easier to take since it is a smaller pill.  Please use this only for 1 week - if your pain persists, you will need to follow up with your doctor in the office for ongoing guidance and pain control.     Thank you for letting us take care of you today!  Please obtain all of your results from medical records or have your doctors office obtain the results - share them with your doctor - you should be seen at your doctors office in the next 2 days. Call today to arrange your follow up. Take the medications as prescribed. Please review all of the medicines and only take them if you do not have an allergy to them. Please be aware that if you are taking birth control pills, taking other prescriptions, ESPECIALLY ANTIBIOTICS may make the birth control ineffective - if this is the case, either do not engage in sexual activity or use alternative methods of birth control such as condoms until you have finished the medicine and your family doctor says it is OK to restart them. If you are on a blood thinner such as COUMADIN, be aware that any other medicine  that you take may cause the coumadin to either work too much, or not enough - you should have your coumadin level rechecked in next 7 days if this is the case.  ?  It is also a possibility that you have an allergic reaction to any of the medicines that you have been prescribed - Everybody reacts differently to medications and while MOST people have no trouble with most medicines, you may have a reaction such as nausea, vomiting, rash, swelling, shortness of breath. If this is the case, please stop taking the medicine immediately and contact your physician.   If you were given a medication in the ED such as percocet, vicodin, or morphine, be aware that these medicines are sedating and may change your ability to take care of yourself adequately for several hours after being given this medicines - you should not drive or take care of small children if you were given this medicine in the Emergency Department or if you have been prescribed these types of medicines. ?   You should return to the ER IMMEDIATELY if you develop severe or worsening symptoms.

## 2020-12-26 NOTE — ED Triage Notes (Signed)
Pt c/o chest tightness, sore throat, nasal drainage that started last night. Denies SOB.

## 2020-12-26 NOTE — ED Provider Notes (Signed)
[facility] EMERGENCY DEPARTMENT Provider Note   CSN: [id number] Arrival date & time: 12/26/20  1507     History Chief Complaint  Patient presents with   Chest Pain    Tiffany Underwood is a 50 y.o. female.   Chest Pain  This patient is a 50 year old female, she has no prior significant cardiac medical history, she does not smoke cigarettes, she does not have any history of exertional symptoms but presents today with a complaint of intermittent chest pain which has been going on for several weeks, last for less than 10 seconds and can come on when she is sitting standing walking or laying down in bed.  She is not having pain at this time.  She went to her doctor's office today because she was having a sore throat, she felt like she had some runny nose last night, she had not been having any fevers.  When she got to the office today she was noted to have a hoarseness or raspiness to her voice and because of the risk for COVID or flu she was sent to the emergency department for further evaluation.  She has never had cardiac disease, has never seen a cardiologist, she is not having active symptoms at this time with regards to shortness of breath or chest pain.  She denies any swelling of her legs.  No medications have been given prior to arrival.  Past Medical History:  Diagnosis Date   Angio-edema    Primary sent here to determine   Fibroids    Thyroid disease    Urticaria     Patient Active Problem List   Diagnosis Date Noted   Encounter for well woman exam with routine gynecological exam 07/17/2020   Encounter for screening fecal occult blood testing 07/17/2020   Screening for colorectal cancer 07/17/2020   Acute left-sided low back pain without sciatica 07/17/2020   Encounter for surveillance of vaginal ring hormonal contraceptive device 07/17/2020   Elevated blood-pressure reading without diagnosis of hypertension 12/20/2014   Idiopathic urticaria 11/16/2014   Angioedema  11/16/2014   Allergic rhinitis 11/16/2014   Urinary frequency 10/25/2014   Chronic idiopathic constipation 10/25/2014   Encounter for routine gynecological examination 10/07/2012    Past Surgical History:  Procedure Laterality Date   COLONOSCOPY WITH PROPOFOL N/A 09/19/2020   Procedure: COLONOSCOPY WITH PROPOFOL;  Surgeon: Eloise Harman, DO;  Location: [facility] SUITE;  Service: Endoscopy;  Laterality: N/A;  ASA II / 10:00   UTERINE FIBROID SURGERY       OB History     Gravida  1   Para  1   Term      Preterm      AB      Living  1      SAB      IAB      Ectopic      Multiple      Live Births  1           Family History  Problem Relation Age of Onset   Cancer Mother 56       breast   Cancer Maternal Grandmother        breast    Social History   Tobacco Use   Smoking status: Never   Smokeless tobacco: Never  Vaping Use   Vaping Use: Never used  Substance Use Topics   Alcohol use: No   Drug use: No    Home Medications Prior to Admission medications  Medication Sig Start Date End Date Taking? Authorizing Provider  naproxen (NAPROSYN) 500 MG tablet Take 1 tablet (500 mg total) by mouth 2 (two) times daily with a meal. 12/26/20  Yes Noemi Chapel, MD  predniSONE (DELTASONE) 20 MG tablet Take 2 tablets (40 mg total) by mouth daily. 12/26/20  Yes Noemi Chapel, MD  ALPRAZolam (XANAX XR) 1 MG 24 hr tablet Take 1 mg by mouth in the morning.    [provider]  clobetasol ointment (TEMOVATE) 2.95 % Apply 1 application topically 2 (two) times daily as needed (skin reaction (sun exposure)).    [provider]  cyclobenzaprine (FLEXERIL) 5 MG tablet Take 1 tablet (5 mg total) by mouth 3 (three) times daily as needed for muscle spasms. 07/17/20   Estill Dooms, NP  EPINEPHrine 0.3 mg/0.3 mL IJ SOAJ injection Inject 0.3 mLs (0.3 mg total) into the muscle once. For severe allergic reaction with difficulty breathing or  swallowing. Patient taking differently: Inject 0.3 mg into the muscle as needed for anaphylaxis. For severe allergic reaction with difficulty breathing or swallowing. 01/28/15   Rancour, Annie Main, MD  etonogestrel-ethinyl estradiol (NUVARING) 0.12-0.015 MG/24HR vaginal ring INSERT 1 RING VAGINALLY FOR 28 AND INSERT NEW RING the same day as removal (continuous use regimen) Patient taking differently: Place 1 each vaginally every 28 (twenty-eight) days. INSERT 1 RING VAGINALLY FOR 28 AND INSERT NEW RING the same day as removal (continuous use regimen) 07/17/20   Estill Dooms, NP  levothyroxine (SYNTHROID, LEVOTHROID) 125 MCG tablet Take 125 mcg by mouth daily before breakfast.    [provider]  diphenhydrAMINE (BENADRYL) 25 MG tablet Take 1 tablet (25 mg total) by mouth every 6 (six) hours. 01/28/15 01/12/19  Rancour, Annie Main, MD    Allergies    Nitrofuran derivatives and Sulfa antibiotics  Review of Systems   Review of Systems  Cardiovascular:  Positive for chest pain.  All other systems reviewed and are negative.  Physical Exam Updated Vital Signs BP (!) 135/92   Pulse 66   Temp 98.1 F (36.7 C) (Oral)   Resp 19   Ht 1.702 m (5\' 7" )   Wt 87.5 kg   SpO2 100%   BMI 30.23 kg/m   Physical Exam Vitals and nursing note reviewed.  Constitutional:      General: She is not in acute distress.    Appearance: She is well-developed.  HENT:     Head: Normocephalic and atraumatic.     Mouth/Throat:     Pharynx: No oropharyngeal exudate.  Eyes:     General: No scleral icterus.       Right eye: No discharge.        Left eye: No discharge.     Conjunctiva/sclera: Conjunctivae normal.     Pupils: Pupils are equal, round, and reactive to light.  Neck:     Thyroid: No thyromegaly.     Vascular: No JVD.  Cardiovascular:     Rate and Rhythm: Normal rate and regular rhythm.     Heart sounds: Normal heart sounds. No murmur heard.   No friction rub. No gallop.  Pulmonary:      Effort: Pulmonary effort is normal. No respiratory distress.     Breath sounds: Normal breath sounds. No wheezing or rales.  Abdominal:     General: Bowel sounds are normal. There is no distension.     Palpations: Abdomen is soft. There is no mass.     Tenderness: There is no abdominal tenderness.  Musculoskeletal:        General: No tenderness. Normal range of motion.     Cervical back: Normal range of motion and neck supple.     Right lower leg: No edema.     Left lower leg: No edema.  Lymphadenopathy:     Cervical: No cervical adenopathy.  Skin:    General: Skin is warm and dry.     Findings: No erythema or rash.  Neurological:     Mental Status: She is alert.     Coordination: Coordination normal.  Psychiatric:        Behavior: Behavior normal.    ED Results / Procedures / Treatments   Labs (all labs ordered are listed, but only abnormal results are displayed) Labs Reviewed  BASIC METABOLIC PANEL - Abnormal; Notable for the following components:      Result Value   CO2 21 (*)    All other components within normal limits  RESP PANEL BY RT-PCR (FLU A&B, COVID) ARPGX2  CBC WITH DIFFERENTIAL/PLATELET  TROPONIN I (HIGH SENSITIVITY)    EKG EKG Interpretation  Date/Time:  Tuesday December 26 2020 15:27:34 EST Ventricular Rate:  73 PR Interval:    QRS Duration: 118 QT Interval:  410 QTC Calculation: 452 R Axis:   11 Text Interpretation: Normal sinus rhythm Nonspecific intraventricular conduction delay No old tracing to compare Confirmed by Noemi Chapel [phone]) on 12/26/2020 3:34:40 PM  Radiology DG Chest Portable 1 View  Result Date: 12/26/2020 CLINICAL DATA:  chest pain EXAM: PORTABLE CHEST 1 VIEW COMPARISON:  None. FINDINGS: The heart size and mediastinal contours are within normal limits. Both lungs are clear. No visible pleural effusions or pneumothorax. No acute osseous abnormality. IMPRESSION: No evidence of acute cardiopulmonary disease. Electronically Signed    By: Margaretha Sheffield M.D.   On: 12/26/2020 15:49    Procedures Procedures   Medications Ordered in ED Medications  lidocaine (XYLOCAINE) 2 % viscous mouth solution 15 mL (15 mLs Mouth/Throat Given 12/26/20 1759)  predniSONE (DELTASONE) tablet 40 mg (40 mg Oral Given 12/26/20 1759)    ED Course  I have reviewed the triage vital signs and the nursing notes.  Pertinent labs & imaging results that were available during my care of the patient were reviewed by me and considered in my medical decision making (see chart for details).  Clinical Course as of 12/26/20 1925  Tue Dec 26, 2020  1924 COVID-negative, labs reassuring, vital signs unremarkable, chest x-ray without acute findings, patient will be treated for viral laryngitis [BM]    Clinical Course User Index [BM] Noemi Chapel, MD   MDM Rules/Calculators/A&P                           Despite the slightly hoarse voice the patient has an unremarkable exam other than minimal hypertension.  Her EKG is unremarkable, we will obtain a chest x-ray as well as a COVID and flu swab.  The patient does report to me that she has a history of having intermittent vocal cord dysfunction in the past that caused some hoarseness that lasted for weeks to months.  She is not having any difficulty tolerating secretions or breathing, at this time we will obtain a chest x-ray labs and a COVID swab.  Her EKG is nonischemic.  She is agreeable to the plan  Final Clinical Impression(s) / ED Diagnoses Final diagnoses:  Laryngitis    Rx / DC Orders ED Discharge Orders  Ordered    predniSONE (DELTASONE) 20 MG tablet  Daily        12/26/20 1925    naproxen (NAPROSYN) 500 MG tablet  2 times daily with meals        12/26/20 1925             Noemi Chapel, MD 12/26/20 [phone]

## 2020-12-31 ENCOUNTER — Telehealth: Payer: BC Managed Care – PPO | Admitting: Family

## 2020-12-31 DIAGNOSIS — B9689 Other specified bacterial agents as the cause of diseases classified elsewhere: Secondary | ICD-10-CM | POA: Diagnosis not present

## 2020-12-31 DIAGNOSIS — J208 Acute bronchitis due to other specified organisms: Secondary | ICD-10-CM | POA: Diagnosis not present

## 2020-12-31 MED ORDER — AZITHROMYCIN 250 MG PO TABS: ORAL_TABLET | ORAL | 0 refills | Status: DC

## 2020-12-31 NOTE — Progress Notes (Signed)
Virtual Visit Consent   Tiffany Underwood, you are scheduled for a virtual visit with a [facility] provider today.     Just as with appointments in the office, your consent must be obtained to participate.  Your consent will be active for this visit and any virtual visit you may have with one of our providers in the next 365 days.     If you have a MyChart account, a copy of this consent can be sent to you electronically.  All virtual visits are billed to your insurance company just like a traditional visit in the office.    As this is a virtual visit, video technology does not allow for your provider to perform a traditional examination.  This may limit your provider's ability to fully assess your condition.  If your provider identifies any concerns that need to be evaluated in person or the need to arrange testing (such as labs, EKG, etc.), we will make arrangements to do so.     Although advances in technology are sophisticated, we cannot ensure that it will always work on either your end or our end.  If the connection with a video visit is poor, the visit may have to be switched to a telephone visit.  With either a video or telephone visit, we are not always able to ensure that we have a secure connection.     I need to obtain your verbal consent now.   Are you willing to proceed with your visit today?    PERLIE STENE has provided verbal consent on 12/31/2020 for a virtual visit (video or telephone).   Evelina Dun, FNP   Date: 12/31/2020 3:12 PM   Virtual Visit via Video Note   I, Evelina Dun, connected with  Tiffany Underwood  ([id number], Sep 01, 1970) on 12/31/20 at  3:00 PM EST by a video-enabled telemedicine application and verified that I am speaking with the correct person using two identifiers.  Location: Patient: Virtual Visit Location Patient: Home Provider: Virtual Visit Location Provider: Home Office   I discussed the limitations of evaluation and management by  telemedicine and the availability of in person appointments. The patient expressed understanding and agreed to proceed.    History of Present Illness: Tiffany Underwood is a 50 y.o. who identifies as a female who was assigned female at birth, and is being seen today for sore throat. She went to the ED on 12/26/20 and was negative strep, flu, and COVID. She was given prednisone and naprosyn without relief. She reports her symptoms have worsen.    HPI: Cough This is a new problem. The current episode started 1 to 4 weeks ago. The problem has been gradually worsening. The problem occurs every few minutes. The cough is Productive of sputum. Associated symptoms include ear congestion, ear pain, nasal congestion, postnasal drip and a sore throat. Pertinent negatives include no chills, fever, headaches, myalgias, shortness of breath or wheezing. The symptoms are aggravated by lying down. She has tried rest and oral steroids for the symptoms. The treatment provided mild relief.   Problems:  Patient Active Problem List   Diagnosis Date Noted   Encounter for well woman exam with routine gynecological exam 07/17/2020   Encounter for screening fecal occult blood testing 07/17/2020   Screening for colorectal cancer 07/17/2020   Acute left-sided low back pain without sciatica 07/17/2020   Encounter for surveillance of vaginal ring hormonal contraceptive device 07/17/2020   Elevated blood-pressure reading without diagnosis of hypertension  12/20/2014   Idiopathic urticaria 11/16/2014   Angioedema 11/16/2014   Allergic rhinitis 11/16/2014   Urinary frequency 10/25/2014   Chronic idiopathic constipation 10/25/2014   Encounter for routine gynecological examination 10/07/2012    Allergies:  Allergies  Allergen Reactions   Nitrofuran Derivatives Rash   Sulfa Antibiotics Rash   Medications:  Current Outpatient Medications:    azithromycin (ZITHROMAX) 250 MG tablet, Take 500 mg once, then 250 mg for four days,  Disp: 6 tablet, Rfl: 0   ALPRAZolam (XANAX XR) 1 MG 24 hr tablet, Take 1 mg by mouth in the morning., Disp: , Rfl:    clobetasol ointment (TEMOVATE) 9.35 %, Apply 1 application topically 2 (two) times daily as needed (skin reaction (sun exposure))., Disp: , Rfl:    EPINEPHrine 0.3 mg/0.3 mL IJ SOAJ injection, Inject 0.3 mLs (0.3 mg total) into the muscle once. For severe allergic reaction with difficulty breathing or swallowing. (Patient taking differently: Inject 0.3 mg into the muscle as needed for anaphylaxis. For severe allergic reaction with difficulty breathing or swallowing.), Disp: 1 Device, Rfl: 1   etonogestrel-ethinyl estradiol (NUVARING) 0.12-0.015 MG/24HR vaginal ring, INSERT 1 RING VAGINALLY FOR 28 AND INSERT NEW RING the same day as removal (continuous use regimen) (Patient taking differently: Place 1 each vaginally every 28 (twenty-eight) days. INSERT 1 RING VAGINALLY FOR 28 AND INSERT NEW RING the same day as removal (continuous use regimen)), Disp: 3 each, Rfl: 4   levothyroxine (SYNTHROID, LEVOTHROID) 125 MCG tablet, Take 125 mcg by mouth daily before breakfast., Disp: , Rfl:   Observations/Objective: Patient is well-developed, well-nourished in no acute distress.  Resting comfortably  at home.  Head is normocephalic, atraumatic.  No labored breathing.  Speech is clear and coherent with logical content.  Patient is alert and oriented at baseline.  Hoarse voice   Assessment and Plan: 1. Acute bacterial bronchitis - azithromycin (ZITHROMAX) 250 MG tablet; Take 500 mg once, then 250 mg for four days  Dispense: 6 tablet; Refill: 0 - Take meds as prescribed - Use a cool mist humidifier  -Use saline nose sprays frequently -Force fluids -For any cough or congestion  Use plain Mucinex- regular strength or max strength is fine -For fever or aces or pains- take tylenol or ibuprofen. -Throat lozenges if help -New toothbrush in 3 days   Follow Up Instructions: I discussed the  assessment and treatment plan with the patient. The patient was provided an opportunity to ask questions and all were answered. The patient agreed with the plan and demonstrated an understanding of the instructions.  A copy of instructions were sent to the patient via MyChart unless otherwise noted below.     The patient was advised to call back or seek an in-person evaluation if the symptoms worsen or if the condition fails to improve as anticipated.  Time:  I spent 12 minutes with the patient via telehealth technology discussing the above problems/concerns.    Evelina Dun, FNP

## 2021-01-26 ENCOUNTER — Ambulatory Visit
Admission: EM | Admit: 2021-01-26 | Discharge: 2021-01-26 | Disposition: A | Discharge status: Home / Self Care | Payer: BC Managed Care – PPO | Attending: Family Medicine | Admitting: Family Medicine

## 2021-01-26 ENCOUNTER — Other Ambulatory Visit: Payer: Self-pay | Admitting: Family Medicine

## 2021-01-26 ENCOUNTER — Other Ambulatory Visit: Payer: Self-pay

## 2021-01-26 DIAGNOSIS — R490 Dysphonia: Secondary | ICD-10-CM

## 2021-01-26 DIAGNOSIS — J029 Acute pharyngitis, unspecified: Secondary | ICD-10-CM

## 2021-01-26 DIAGNOSIS — R062 Wheezing: Secondary | ICD-10-CM

## 2021-01-26 DIAGNOSIS — J3089 Other allergic rhinitis: Secondary | ICD-10-CM

## 2021-01-26 MED ORDER — ALBUTEROL SULFATE HFA 108 (90 BASE) MCG/ACT IN AERS: 2.0000 | INHALATION_SPRAY | Freq: Four times a day (QID) | RESPIRATORY_TRACT | 0 refills | Status: AC | PRN

## 2021-01-26 MED ORDER — DEXAMETHASONE SODIUM PHOSPHATE 10 MG/ML IJ SOLN
10.0000 mg | Freq: Once | INTRAMUSCULAR | Status: AC
  Administered 2021-01-26: 10 mg via INTRAMUSCULAR

## 2021-01-26 MED ORDER — FLUTICASONE PROPIONATE 50 MCG/ACT NA SUSP: 1.0000 | Freq: Two times a day (BID) | NASAL | 2 refills | Status: DC

## 2021-01-26 MED ORDER — CETIRIZINE HCL 10 MG PO TABS: 10.0000 mg | ORAL_TABLET | Freq: Every day | ORAL | 2 refills | Status: AC

## 2021-01-26 MED ORDER — LIDOCAINE VISCOUS HCL 2 % MT SOLN: 10.0000 mL | OROMUCOSAL | 0 refills | Status: DC | PRN

## 2021-01-26 NOTE — ED Provider Notes (Signed)
[facility]    CSN: [id number] Arrival date & time: 01/26/21  1211      History   Chief Complaint Chief Complaint  Patient presents with   Cough   Shortness of Breath    HPI Tiffany Underwood is a 50 y.o. female.   Presenting today with intermittent laryngitis, nasal congestion, sinus pressure, hacking cough.  She states she called and did a televisit with her primary care provider earlier this week who gave an antibiotic for her laryngitis but she is still having a sore throat, hoarseness, wheezing, cough.  She denies chest pain, shortness of breath, abdominal pain, nausea vomiting or diarrhea.  No new sick contacts recently.  Trying over-the-counter cold and congestion medications with minimal relief.  Past Medical History:  Diagnosis Date   Angio-edema    Primary sent here to determine   Fibroids    Thyroid disease    Urticaria     Patient Active Problem List   Diagnosis Date Noted   Encounter for well woman exam with routine gynecological exam 07/17/2020   Encounter for screening fecal occult blood testing 07/17/2020   Screening for colorectal cancer 07/17/2020   Acute left-sided low back pain without sciatica 07/17/2020   Encounter for surveillance of vaginal ring hormonal contraceptive device 07/17/2020   Elevated blood-pressure reading without diagnosis of hypertension 12/20/2014   Idiopathic urticaria 11/16/2014   Angioedema 11/16/2014   Allergic rhinitis 11/16/2014   Urinary frequency 10/25/2014   Chronic idiopathic constipation 10/25/2014   Encounter for routine gynecological examination 10/07/2012    Past Surgical History:  Procedure Laterality Date   COLONOSCOPY WITH PROPOFOL N/A 09/19/2020   Procedure: COLONOSCOPY WITH PROPOFOL;  Surgeon: Eloise Harman, DO;  Location: [facility];  Service: Endoscopy;  Laterality: N/A;  ASA II / 10:00   UTERINE FIBROID SURGERY     OB History     Gravida  1   Para  1   Term      Preterm      AB       Living  1      SAB      IAB      Ectopic      Multiple      Live Births  1            Home Medications    Prior to Admission medications   Medication Sig Start Date End Date Taking? Authorizing Provider  albuterol (VENTOLIN HFA) 108 (90 Base) MCG/ACT inhaler Inhale 2 puffs into the lungs every 6 (six) hours as needed for wheezing or shortness of breath. 01/26/21  Yes Volney American, PA-C  cetirizine (ZYRTEC ALLERGY) 10 MG tablet Take 1 tablet (10 mg total) by mouth daily. 01/26/21  Yes Volney American, PA-C  fluticasone [facility]) 50 MCG/ACT nasal spray Place 1 spray into both nostrils 2 (two) times daily. 01/26/21  Yes Volney American, PA-C  lidocaine (XYLOCAINE) 2 % solution Use as directed 10 mLs in the mouth or throat as needed for mouth pain. 01/26/21  Yes Volney American, PA-C  ALPRAZolam (XANAX XR) 1 MG 24 hr tablet Take 1 mg by mouth in the morning.    [provider]  azithromycin (ZITHROMAX) 250 MG tablet Take 500 mg once, then 250 mg for four days 12/31/20   Evelina Dun A, FNP  clobetasol ointment (TEMOVATE) 5.99 % Apply 1 application topically 2 (two) times daily as needed (skin reaction (sun exposure)).  [provider]  EPINEPHrine 0.3 mg/0.3 mL IJ SOAJ injection Inject 0.3 mLs (0.3 mg total) into the muscle once. For severe allergic reaction with difficulty breathing or swallowing. Patient taking differently: Inject 0.3 mg into the muscle as needed for anaphylaxis. For severe allergic reaction with difficulty breathing or swallowing. 01/28/15   Rancour, Annie Main, MD  etonogestrel-ethinyl estradiol (NUVARING) 0.12-0.015 MG/24HR vaginal ring INSERT 1 RING VAGINALLY FOR 28 AND INSERT NEW RING the same day as removal (continuous use regimen) Patient taking differently: Place 1 each vaginally every 28 (twenty-eight) days. INSERT 1 RING VAGINALLY FOR 28 AND INSERT NEW RING the same day as removal (continuous use  regimen) 07/17/20   Estill Dooms, NP  levothyroxine (SYNTHROID, LEVOTHROID) 125 MCG tablet Take 125 mcg by mouth daily before breakfast.    [provider]  diphenhydrAMINE (BENADRYL) 25 MG tablet Take 1 tablet (25 mg total) by mouth every 6 (six) hours. 01/28/15 01/12/19  Ezequiel Essex, MD    Family History Family History  Problem Relation Age of Onset   Cancer Mother 78       breast   Cancer Maternal Grandmother        breast    Social History Social History   Tobacco Use   Smoking status: Never   Smokeless tobacco: Never  Vaping Use   Vaping Use: Never used  Substance Use Topics   Alcohol use: No   Drug use: No     Allergies   Nitrofuran derivatives and Sulfa antibiotics   Review of Systems Review of Systems Per HPI  Physical Exam Triage Vital Signs ED Triage Vitals  Enc Vitals Group     BP 01/26/21 1234 (!) 151/94     Pulse Rate 01/26/21 1234 69     Resp 01/26/21 1234 18     Temp 01/26/21 1234 98.1 F (36.7 C)     Temp Source 01/26/21 1234 Oral     SpO2 01/26/21 1234 99 %     Weight --      Height --      Head Circumference --      Peak Flow --      Pain Score 01/26/21 1237 0     Pain Loc --      Pain Edu? --      Excl. in [place]? --    No data found.  Updated Vital Signs BP (!) 151/94 (BP Location: Right Arm)    Pulse 69    Temp 98.1 F (36.7 C) (Oral)    Resp 18    SpO2 99%   Visual Acuity Right Eye Distance:   Left Eye Distance:   Bilateral Distance:    Right Eye Near:   Left Eye Near:    Bilateral Near:     Physical Exam Vitals and nursing note reviewed.  Constitutional:      Appearance: Normal appearance.  HENT:     Head: Atraumatic.     Right Ear: Tympanic membrane and external ear normal.     Left Ear: Tympanic membrane and external ear normal.     Nose: Rhinorrhea present.     Comments: Significant edema and erythema of bilateral nasal turbinates    Mouth/Throat:     Mouth: Mucous membranes are moist.      Pharynx: Posterior oropharyngeal erythema present.  Eyes:     Extraocular Movements: Extraocular movements intact.     Conjunctiva/sclera: Conjunctivae normal.  Cardiovascular:     Rate and Rhythm: Normal rate and  regular rhythm.     Heart sounds: Normal heart sounds.  Pulmonary:     Effort: Pulmonary effort is normal.     Breath sounds: Wheezing present. No rales.     Comments: Minimal scattered wheezes bilaterally Musculoskeletal:        General: Normal range of motion.     Cervical back: Normal range of motion and neck supple.  Skin:    General: Skin is warm and dry.  Neurological:     Mental Status: She is alert and oriented to person, place, and time.     Motor: No weakness.     Gait: Gait normal.  Psychiatric:        Mood and Affect: Mood normal.        Thought Content: Thought content normal.     UC Treatments / Results  Labs (all labs ordered are listed, but only abnormal results are displayed) Labs Reviewed - No data to display  EKG   Radiology No results found.  Procedures Procedures (including critical care time)  Medications Ordered in UC Medications  dexamethasone (DECADRON) injection 10 mg (10 mg Intramuscular Given 01/26/21 1331)    Initial Impression / Assessment and Plan / UC Course  I have reviewed the triage vital signs and the nursing notes.  Pertinent labs & imaging results that were available during my care of the patient were reviewed by me and considered in my medical decision making (see chart for details).     Suspect ongoing intermittent symptoms related to uncontrolled seasonal allergies.  We will treat with IM Decadron, start allergy regimen with Zyrtec and Flonase, viscous lidocaine and albuterol as needed.  Discussed supportive home care and return precautions.  Final Clinical Impressions(s) / UC Diagnoses   Final diagnoses:  Wheezing  Hoarseness  Sore throat  Seasonal allergic rhinitis due to other allergic trigger    Discharge Instructions   None    ED Prescriptions     Medication Sig Dispense Auth. Provider   cetirizine (ZYRTEC ALLERGY) 10 MG tablet Take 1 tablet (10 mg total) by mouth daily. 30 tablet Volney American, PA-C   fluticasone [facility]) 50 MCG/ACT nasal spray Place 1 spray into both nostrils 2 (two) times daily. 16 g Volney American, PA-C   lidocaine (XYLOCAINE) 2 % solution Use as directed 10 mLs in the mouth or throat as needed for mouth pain. 100 mL Volney American, PA-C   albuterol (VENTOLIN HFA) 108 (90 Base) MCG/ACT inhaler Inhale 2 puffs into the lungs every 6 (six) hours as needed for wheezing or shortness of breath. 18 g Volney American, [place]      PDMP not reviewed this encounter.   Volney American, [place] 01/26/21 1337

## 2021-01-26 NOTE — ED Triage Notes (Signed)
Patient states that about a moth ago she had laryngitis.   She started to have a sore throat so she called the PCP and they gave her an antibiotic but she is still having wheezing and her voice has not returned.  She states she has tried Vicks and OTC meds but no relief.  Denies Fever

## 2021-01-27 ENCOUNTER — Other Ambulatory Visit: Payer: Self-pay | Admitting: Family Medicine

## 2021-01-27 NOTE — Telephone Encounter (Signed)
Pt seen at UC by Merrie Roof PA - pt no longer under prescribers care  Requested Prescriptions  Refused Prescriptions Disp Refills   albuterol (VENTOLIN HFA) 108 (90 Base) MCG/ACT inhaler [Pharmacy Med Name: ALBUTEROL HFA INH(200 PUFFS)18GM] 54 g     Sig: INHALE 2 PUFFS INTO THE LUNGS EVERY 6 HOURS AS NEEDED FOR WHEEZING OR SHORTNESS OF BREATH     Pulmonology:  Beta Agonists Failed - 01/26/2021  3:08 PM      Failed - One inhaler should last at least one month. If the patient is requesting refills earlier, contact the patient to check for uncontrolled symptoms.      Failed - Valid encounter within last 12 months    Recent Outpatient Visits   None             ]

## 2021-01-28 NOTE — Telephone Encounter (Signed)
seen at [facility] visit and was prescribed two days ago- call PCP for refills  Requested Prescriptions  Refused Prescriptions Disp Refills   fluticasone (FLONASE) 50 MCG/ACT nasal spray [Pharmacy Med Name: FLUTICASONE 50MCG NASAL SP (120) RX] 16 g 2    Sig: SHAKE LIQUID AND USE 1 SPRAY IN EACH NOSTRIL TWICE DAILY     Ear, Nose, and Throat: Nasal Preparations - Corticosteroids Failed - 01/27/2021  8:05 AM      Failed - Valid encounter within last 12 months    Recent Outpatient Visits   None

## 2021-02-07 DIAGNOSIS — E6609 Other obesity due to excess calories: Secondary | ICD-10-CM | POA: Diagnosis not present

## 2021-02-07 DIAGNOSIS — T783XXS Angioneurotic edema, sequela: Secondary | ICD-10-CM | POA: Diagnosis not present

## 2021-02-07 DIAGNOSIS — E063 Autoimmune thyroiditis: Secondary | ICD-10-CM | POA: Diagnosis not present

## 2021-02-07 DIAGNOSIS — Z6831 Body mass index (BMI) 31.0-31.9, adult: Secondary | ICD-10-CM | POA: Diagnosis not present

## 2021-02-07 DIAGNOSIS — R0789 Other chest pain: Secondary | ICD-10-CM | POA: Diagnosis not present

## 2021-02-07 DIAGNOSIS — F419 Anxiety disorder, unspecified: Secondary | ICD-10-CM | POA: Diagnosis not present

## 2021-02-07 DIAGNOSIS — E039 Hypothyroidism, unspecified: Secondary | ICD-10-CM | POA: Diagnosis not present

## 2021-02-07 DIAGNOSIS — E890 Postprocedural hypothyroidism: Secondary | ICD-10-CM | POA: Diagnosis not present

## 2021-02-07 DIAGNOSIS — J383 Other diseases of vocal cords: Secondary | ICD-10-CM | POA: Diagnosis not present

## 2021-02-18 ENCOUNTER — Other Ambulatory Visit: Payer: Self-pay | Admitting: Family Medicine

## 2021-02-18 NOTE — Telephone Encounter (Signed)
last RF 01/26/21 from [place]- pt no longer under prescribers care  Requested Prescriptions  Refused Prescriptions Disp Refills   albuterol (VENTOLIN HFA) 108 (90 Base) MCG/ACT inhaler [Pharmacy Med Name: ALBUTEROL HFA INH(200 PUFFS)18GM] 18 g 0    Sig: INHALE 2 PUFFS INTO THE LUNGS EVERY 6 HOURS AS NEEDED FOR WHEEZING OR SHORTNESS OF BREATH     Pulmonology:  Beta Agonists Failed - 02/18/2021  9:24 AM      Failed - One inhaler should last at least one month. If the patient is requesting refills earlier, contact the patient to check for uncontrolled symptoms.      Failed - Valid encounter within last 12 months    Recent Outpatient Visits   None

## 2021-03-21 DIAGNOSIS — E6609 Other obesity due to excess calories: Secondary | ICD-10-CM | POA: Diagnosis not present

## 2021-03-21 DIAGNOSIS — E039 Hypothyroidism, unspecified: Secondary | ICD-10-CM | POA: Diagnosis not present

## 2021-03-21 DIAGNOSIS — Z6831 Body mass index (BMI) 31.0-31.9, adult: Secondary | ICD-10-CM | POA: Diagnosis not present

## 2021-03-21 DIAGNOSIS — S239XXA Sprain of unspecified parts of thorax, initial encounter: Secondary | ICD-10-CM | POA: Diagnosis not present

## 2021-03-29 ENCOUNTER — Other Ambulatory Visit: Payer: Self-pay | Admitting: Family Medicine

## 2021-03-29 ENCOUNTER — Other Ambulatory Visit (HOSPITAL_COMMUNITY): Payer: Self-pay | Admitting: Family Medicine

## 2021-03-29 DIAGNOSIS — R221 Localized swelling, mass and lump, neck: Secondary | ICD-10-CM

## 2021-03-29 DIAGNOSIS — Z6831 Body mass index (BMI) 31.0-31.9, adult: Secondary | ICD-10-CM | POA: Diagnosis not present

## 2021-03-29 DIAGNOSIS — T380X5A Adverse effect of glucocorticoids and synthetic analogues, initial encounter: Secondary | ICD-10-CM | POA: Diagnosis not present

## 2021-04-06 ENCOUNTER — Ambulatory Visit (HOSPITAL_COMMUNITY)
Admission: RE | Admit: 2021-04-06 | Discharge: 2021-04-06 | Disposition: A | Discharge status: Home / Self Care | Payer: BC Managed Care – PPO | Source: Ambulatory Visit | Attending: Family Medicine | Admitting: Family Medicine

## 2021-04-06 ENCOUNTER — Other Ambulatory Visit: Payer: Self-pay

## 2021-04-06 DIAGNOSIS — Z0389 Encounter for observation for other suspected diseases and conditions ruled out: Secondary | ICD-10-CM | POA: Diagnosis not present

## 2021-04-06 DIAGNOSIS — R221 Localized swelling, mass and lump, neck: Secondary | ICD-10-CM | POA: Diagnosis not present

## 2021-07-12 DIAGNOSIS — F419 Anxiety disorder, unspecified: Secondary | ICD-10-CM | POA: Diagnosis not present

## 2021-07-26 ENCOUNTER — Ambulatory Visit (INDEPENDENT_AMBULATORY_CARE_PROVIDER_SITE_OTHER): Payer: BC Managed Care – PPO | Admitting: Adult Health

## 2021-07-26 ENCOUNTER — Encounter: Payer: Self-pay | Admitting: Adult Health

## 2021-07-26 VITALS — BP 120/89 | HR 83 | Ht 66.25 in | Wt 185.0 lb

## 2021-07-26 DIAGNOSIS — R8290 Unspecified abnormal findings in urine: Secondary | ICD-10-CM | POA: Diagnosis not present

## 2021-07-26 DIAGNOSIS — E039 Hypothyroidism, unspecified: Secondary | ICD-10-CM

## 2021-07-26 DIAGNOSIS — R319 Hematuria, unspecified: Secondary | ICD-10-CM

## 2021-07-26 DIAGNOSIS — Z01419 Encounter for gynecological examination (general) (routine) without abnormal findings: Secondary | ICD-10-CM | POA: Diagnosis not present

## 2021-07-26 DIAGNOSIS — Z1211 Encounter for screening for malignant neoplasm of colon: Secondary | ICD-10-CM | POA: Diagnosis not present

## 2021-07-26 DIAGNOSIS — Z3044 Encounter for surveillance of vaginal ring hormonal contraceptive device: Secondary | ICD-10-CM

## 2021-07-26 LAB — POCT URINALYSIS DIPSTICK
Glucose, UA: NEGATIVE
Ketones, UA: NEGATIVE
Leukocytes, UA: NEGATIVE
Nitrite, UA: NEGATIVE
Protein, UA: NEGATIVE

## 2021-07-26 LAB — HEMOCCULT GUIAC POC 1CARD (OFFICE): Fecal Occult Blood, POC: NEGATIVE

## 2021-07-26 MED ORDER — ETONOGESTREL-ETHINYL ESTRADIOL 0.12-0.015 MG/24HR VA RING: VAGINAL_RING | VAGINAL | 4 refills | Status: DC

## 2021-07-26 NOTE — Progress Notes (Signed)
Patient ID: Tiffany Underwood, female   DOB: 01/16/1971, 51 y.o.   MRN: [id number] History of Present Illness: Tiffany Underwood is a 51 year old black female, married, G1P1 in for a well woman gyn exam and requests thyroid labs. She has some discomfort right scapular area at times, better if husband rubs it. Urine has odor esp in am. PCP is Dr Hilma Favors.   Lab Results  Component Value Date   DIAGPAP  07/16/2019    - Negative for intraepithelial lesion or malignancy (NILM)   HPV NOT DETECTED 12/03/2017   [place] Negative 07/16/2019      Current Medications, Allergies, Past Medical History, Past Surgical History, Family History and Social History were reviewed in [employer] record.     Review of Systems: Patient denies any headaches, hearing loss, fatigue, blurred vision, shortness of breath, chest pain, abdominal pain, problems with bowel movements,  or intercourse. No joint pain or mood swings.  No periods with nuva ring. See HPI for positives.   Physical Exam:BP 120/89 (BP Location: Left Arm, Patient Position: Sitting, Cuff Size: Normal)   Pulse 83   Ht 5' 6.25" (1.683 m)   Wt 185 lb (83.9 kg)   BMI 29.63 kg/m  urine dipstick 2+ blood.  General:  Well developed, well nourished, no acute distress Skin:  Warm and dry Neck:  Midline trachea, normal thyroid, good ROM, no lymphadenopathy Lungs; Clear to auscultation bilaterally Breast:  No dominant palpable mass, retraction, or nipple discharge Cardiovascular: Regular rate and rhythm, has point tenderness right scapula Abdomen:  Soft, non tender, no hepatosplenomegaly Pelvic:  External genitalia is normal in appearance, no lesions.  The vagina is normal in appearance. Nuva ring in place. Urethra has no lesions or masses. The cervix is bulbous.  Uterus is felt to be normal size, shape, and contour.  No adnexal masses or tenderness noted.Bladder is non tender, no masses felt. Rectal: Good sphincter tone, no polyps, or  hemorrhoids felt.  Hemoccult negative. Extremities/musculoskeletal:  No swelling or varicosities noted, no clubbing or cyanosis Psych:  No mood changes, alert and cooperative,seems happy AA is 1 Fall risk is low    07/26/2021   11:41 AM 07/17/2020    8:39 AM 07/16/2019    9:45 AM  Depression screen PHQ 2/9  Decreased Interest 0 0 0  Down, Depressed, Hopeless 0 0 0  PHQ - 2 Score 0 0 0  Altered sleeping 2 0 0  Tired, decreased energy 2 0 0  Change in appetite 0 0 0  Feeling bad or failure about yourself  0 0 0  Trouble concentrating 0 0 0  Moving slowly or fidgety/restless 0 0 0  Suicidal thoughts 0 0 0  PHQ-9 Score 4 0 0  Difficult doing work/chores   Not difficult at all       07/26/2021   11:41 AM 07/17/2020    8:39 AM 07/16/2019    9:46 AM  GAD 7 : Generalized Anxiety Score  Nervous, Anxious, on Edge 0 0 0  Control/stop worrying 0 0 0  Worry too much - different things 0 0 0  Trouble relaxing 0 0 0  Restless 0 0 0  Easily annoyed or irritable 0 0 0  Afraid - awful might happen 0 0 0  Total GAD 7 Score 0 0 0  Anxiety Difficulty   Not difficult at all      Upstream - 07/26/21 1139       Pregnancy Intention Screening  Does the patient want to become pregnant in the next year? No    Does the patient's partner want to become pregnant in the next year? No    Would the patient like to discuss contraceptive options today? No      Contraception Wrap Up   Current Method Vaginal Ring    End Method Vaginal Ring             Examination chaperoned by Levy Pupa LPN  Impression and Plan: 1. Hematuria, unspecified type Urine sent for [place] C&S - POCT Urinalysis Dipstick - Urine Culture - Urinalysis, Routine w reflex microscopic  2. Abnormal urine odor Urine sent for UA C&S to rule out UTI - POCT Urinalysis Dipstick - Urine Culture - Urinalysis, Routine w reflex microscopic  3. Encounter for well woman exam with routine gynecological exam Pap and physical in 1  year Mammogram yearly Colonoscopy per GI Get massage for pain at scapula   4. Encounter for screening fecal occult blood testing Hemoccult negative  - POCT occult blood stool  5. Encounter for surveillance of vaginal ring hormonal contraceptive device Will refill ring Meds ordered this encounter  Medications   etonogestrel-ethinyl estradiol (NUVARING) 0.12-0.015 MG/24HR vaginal ring    Sig: INSERT 1 RING VAGINALLY FOR 24 days and remove  AND INSERT NEW RING after 3 days    Dispense:  3 each    Refill:  4    Order Specific Question:   Supervising Provider    Answer:   Elonda Husky, LUTHER H [[id number]]     6. Hypothyroidism, unspecified type Will check labs at her request - TSH - T4, free

## 2021-07-27 LAB — URINALYSIS, ROUTINE W REFLEX MICROSCOPIC
Bilirubin, UA: NEGATIVE
Glucose, UA: NEGATIVE
Ketones, UA: NEGATIVE
Leukocytes,UA: NEGATIVE
Nitrite, UA: NEGATIVE
Protein,UA: NEGATIVE
Specific Gravity, UA: 1.025 (ref 1.005–1.030)
Urobilinogen, Ur: 0.2 mg/dL (ref 0.2–1.0)
pH, UA: 6 (ref 5.0–7.5)

## 2021-07-27 LAB — MICROSCOPIC EXAMINATION
Bacteria, UA: NONE SEEN
Casts: NONE SEEN /lpf

## 2021-07-27 LAB — TSH: TSH: 0.257 u[IU]/mL — ABNORMAL LOW (ref 0.450–4.500)

## 2021-07-27 LAB — T4, FREE: Free T4: 1.69 ng/dL (ref 0.82–1.77)

## 2021-07-30 ENCOUNTER — Other Ambulatory Visit: Payer: Self-pay | Admitting: Adult Health

## 2021-07-30 LAB — URINE CULTURE

## 2021-07-30 MED ORDER — AMPICILLIN 500 MG PO CAPS
500.0000 mg | ORAL_CAPSULE | Freq: Four times a day (QID) | ORAL | 0 refills | Status: DC
Start: 2021-07-30 — End: 2022-03-01

## 2021-09-24 ENCOUNTER — Other Ambulatory Visit (HOSPITAL_COMMUNITY): Payer: Self-pay | Admitting: Family Medicine

## 2021-09-24 DIAGNOSIS — Z1231 Encounter for screening mammogram for malignant neoplasm of breast: Secondary | ICD-10-CM

## 2021-10-11 DIAGNOSIS — F419 Anxiety disorder, unspecified: Secondary | ICD-10-CM | POA: Diagnosis not present

## 2021-10-11 DIAGNOSIS — E6609 Other obesity due to excess calories: Secondary | ICD-10-CM | POA: Diagnosis not present

## 2021-10-11 DIAGNOSIS — Z6830 Body mass index (BMI) 30.0-30.9, adult: Secondary | ICD-10-CM | POA: Diagnosis not present

## 2021-11-08 ENCOUNTER — Ambulatory Visit (HOSPITAL_COMMUNITY)
Admission: RE | Admit: 2021-11-08 | Discharge: 2021-11-08 | Disposition: A | Discharge status: Home / Self Care | Payer: BC Managed Care – PPO | Source: Ambulatory Visit | Attending: Family Medicine | Admitting: Family Medicine

## 2021-11-08 DIAGNOSIS — Z1231 Encounter for screening mammogram for malignant neoplasm of breast: Secondary | ICD-10-CM | POA: Insufficient documentation

## 2021-11-19 ENCOUNTER — Ambulatory Visit
Admission: EM | Admit: 2021-11-19 | Discharge: 2021-11-19 | Disposition: A | Discharge status: Home / Self Care | Payer: BC Managed Care – PPO | Attending: Family Medicine | Admitting: Family Medicine

## 2021-11-19 DIAGNOSIS — Z1152 Encounter for screening for COVID-19: Secondary | ICD-10-CM | POA: Diagnosis not present

## 2021-11-19 DIAGNOSIS — J069 Acute upper respiratory infection, unspecified: Secondary | ICD-10-CM | POA: Insufficient documentation

## 2021-11-19 DIAGNOSIS — Z79899 Other long term (current) drug therapy: Secondary | ICD-10-CM | POA: Diagnosis not present

## 2021-11-19 LAB — RESP PANEL BY RT-PCR (FLU A&B, COVID) ARPGX2
Influenza A by PCR: NEGATIVE
Influenza B by PCR: NEGATIVE
SARS Coronavirus 2 by RT PCR: NEGATIVE

## 2021-11-19 MED ORDER — FLUTICASONE PROPIONATE 50 MCG/ACT NA SUSP: 1.0000 | Freq: Two times a day (BID) | NASAL | 2 refills | Status: AC

## 2021-11-19 MED ORDER — METHYLPREDNISOLONE SODIUM SUCC 125 MG IJ SOLR
60.0000 mg | Freq: Once | INTRAMUSCULAR | Status: AC
  Administered 2021-11-19: 60 mg via INTRAMUSCULAR

## 2021-11-19 NOTE — ED Triage Notes (Signed)
Pt reports sinus problems for 2 days, pt took alkezeter, excederin for headaches provided no relief.

## 2021-11-19 NOTE — ED Provider Notes (Signed)
[facility]    CSN: [id number] Arrival date & time: 11/19/21  1350      History   Chief Complaint Chief Complaint  Patient presents with   Sinus Problem    HPI Tiffany Underwood is a 51 y.o. female.   Patient presenting today with 2-day history of sinus pressure, ear pain and pressure, runny nose, sinus headache.  Denies fever, chills, chest pain, shortness of breath, cough, abdominal pain, nausea vomiting or diarrhea.  So far took some Alka-Seltzer cold and sinus with minimal relief.  Does have a history of seasonal allergies on Zyrtec daily.  No known sick contacts recently.    Past Medical History:  Diagnosis Date   Angio-edema    Primary sent here to determine   Fibroids    Thyroid disease    Urticaria     Patient Active Problem List   Diagnosis Date Noted   Abnormal urine odor 07/26/2021   Hematuria 07/26/2021   Hypothyroidism 07/26/2021   Encounter for well woman exam with routine gynecological exam 07/17/2020   Encounter for screening fecal occult blood testing 07/17/2020   Screening for colorectal cancer 07/17/2020   Acute left-sided low back pain without sciatica 07/17/2020   Encounter for surveillance of vaginal ring hormonal contraceptive device 07/17/2020   Elevated blood-pressure reading without diagnosis of hypertension 12/20/2014   Idiopathic urticaria 11/16/2014   Angioedema 11/16/2014   Allergic rhinitis 11/16/2014   Urinary frequency 10/25/2014   Chronic idiopathic constipation 10/25/2014   Encounter for routine gynecological examination 10/07/2012    Past Surgical History:  Procedure Laterality Date   COLONOSCOPY WITH PROPOFOL N/A 09/19/2020   Procedure: COLONOSCOPY WITH PROPOFOL;  Surgeon: Eloise Harman, DO;  [facility];  Service: Endoscopy;  Laterality: N/A;  ASA II / 10:00   UTERINE FIBROID SURGERY      OB History     Gravida  1   Para  1   Term      Preterm      AB      Living  1      SAB       IAB      Ectopic      Multiple      Live Births  1            Home Medications    Prior to Admission medications   Medication Sig Start Date End Date Taking? Authorizing Provider  albuterol (VENTOLIN HFA) 108 (90 Base) MCG/ACT inhaler Inhale 2 puffs into the lungs every 6 (six) hours as needed for wheezing or shortness of breath. 01/26/21   Volney American, PA-C  ALPRAZolam (XANAX XR) 1 MG 24 hr tablet Take 1 mg by mouth in the morning.    [provider]  ampicillin (PRINCIPEN) 500 MG capsule Take 1 capsule (500 mg total) by mouth 4 (four) times daily. 07/30/21   Estill Dooms, NP  cetirizine (ZYRTEC ALLERGY) 10 MG tablet Take 1 tablet (10 mg total) by mouth daily. 01/26/21   Volney American, PA-C  clobetasol ointment (TEMOVATE) 4.27 % Apply 1 application topically 2 (two) times daily as needed (skin reaction (sun exposure)).    [provider]  EPINEPHrine 0.3 mg/0.3 mL IJ SOAJ injection Inject 0.3 mLs (0.3 mg total) into the muscle once. For severe allergic reaction with difficulty breathing or swallowing. Patient taking differently: Inject 0.3 mg into the muscle as needed for anaphylaxis. For severe allergic reaction with difficulty breathing or  swallowing. 01/28/15   Rancour, Annie Main, MD  etonogestrel-ethinyl estradiol (NUVARING) 0.12-0.015 MG/24HR vaginal ring INSERT 1 RING VAGINALLY FOR 24 days and remove  AND INSERT NEW RING after 3 days 07/26/21   Estill Dooms, NP  fluticasone (FLONASE) 50 MCG/ACT nasal spray Place 1 spray into both nostrils 2 (two) times daily. 11/19/21   Volney American, PA-C  levothyroxine (SYNTHROID, LEVOTHROID) 125 MCG tablet Take 125 mcg by mouth daily before breakfast.    [provider]  diphenhydrAMINE (BENADRYL) 25 MG tablet Take 1 tablet (25 mg total) by mouth every 6 (six) hours. 01/28/15 01/12/19  Ezequiel Essex, MD    Family History Family History  Problem Relation Age of Onset    Cancer Mother 15       breast   Cancer Maternal Grandmother        breast    Social History Social History   Tobacco Use   Smoking status: Never   Smokeless tobacco: Never  Vaping Use   Vaping Use: Never used  Substance Use Topics   Alcohol use: No   Drug use: No     Allergies   Nitrofuran derivatives and Sulfa antibiotics   Review of Systems Review of Systems PER HPI  Physical Exam Triage Vital Signs ED Triage Vitals [11/19/21 1421]  Enc Vitals Group     BP (!) 134/93     Pulse Rate 79     Resp 18     Temp 98.1 F (36.7 C)     Temp Source Oral     SpO2 98 %     Weight      Height      Head Circumference      Peak Flow      Pain Score 0     Pain Loc      Pain Edu?      Excl. in [place]?    No data found.  Updated Vital Signs BP (!) 134/93 (BP Location: Right Arm)   Pulse 79   Temp 98.1 F (36.7 C) (Oral)   Resp 18   SpO2 98%   Visual Acuity Right Eye Distance:   Left Eye Distance:   Bilateral Distance:    Right Eye Near:   Left Eye Near:    Bilateral Near:     Physical Exam Vitals and nursing note reviewed.  Constitutional:      Appearance: Normal appearance.  HENT:     Head: Atraumatic.     Right Ear: Tympanic membrane and external ear normal.     Left Ear: Tympanic membrane and external ear normal.     Nose: Rhinorrhea present.     Comments: Bilateral nasal turbinates boggy, erythematous and edematous    Mouth/Throat:     Mouth: Mucous membranes are moist.     Pharynx: Posterior oropharyngeal erythema present.  Eyes:     Extraocular Movements: Extraocular movements intact.     Conjunctiva/sclera: Conjunctivae normal.  Cardiovascular:     Rate and Rhythm: Normal rate and regular rhythm.     Heart sounds: Normal heart sounds.  Pulmonary:     Effort: Pulmonary effort is normal.     Breath sounds: Normal breath sounds. No wheezing.  Musculoskeletal:        General: Normal range of motion.     Cervical back: Normal range of motion and  neck supple.  Skin:    General: Skin is warm and dry.  Neurological:     Mental Status: She is  alert and oriented to person, place, and time.  Psychiatric:        Mood and Affect: Mood normal.        Thought Content: Thought content normal.      UC Treatments / Results  Labs (all labs ordered are listed, but only abnormal results are displayed) Labs Reviewed  RESP PANEL BY RT-PCR (FLU A&B, COVID) [id number]    EKG   Radiology No results found.  Procedures Procedures (including critical care time)  Medications Ordered in UC Medications  methylPREDNISolone sodium succinate (SOLU-MEDROL) 125 mg/2 mL injection 60 mg (has no administration in time range)    Initial Impression / Assessment and Plan / UC Course  I have reviewed the triage vital signs and the nursing notes.  Pertinent labs & imaging results that were available during my care of the patient were reviewed by me and considered in my medical decision making (see chart for details).     Suspect viral sinusitis, treat with Flonase twice daily, decongestants, cold and congestion medications, continued antihistamine therapy, sinus rinses and over-the-counter pain relievers as needed.  She is requesting a steroid shot as well which was given.  Respiratory panel pending for rule out.  Return for any worsening symptoms.  Final Clinical Impressions(s) / UC Diagnoses   Final diagnoses:  Viral URI   Discharge Instructions   None    ED Prescriptions     Medication Sig Dispense Auth. Provider   fluticasone (FLONASE) 50 MCG/ACT nasal spray Place 1 spray into both nostrils 2 (two) times daily. 16 g Volney American, [place]      PDMP not reviewed this encounter.   Volney American, [place] 11/19/21 1447

## 2022-01-15 DIAGNOSIS — R350 Frequency of micturition: Secondary | ICD-10-CM | POA: Diagnosis not present

## 2022-01-15 DIAGNOSIS — N390 Urinary tract infection, site not specified: Secondary | ICD-10-CM | POA: Diagnosis not present

## 2022-03-01 ENCOUNTER — Encounter: Payer: Self-pay | Admitting: Adult Health

## 2022-03-01 ENCOUNTER — Ambulatory Visit: Payer: BC Managed Care – PPO | Admitting: Adult Health

## 2022-03-01 VITALS — BP 133/87 | HR 78 | Ht 67.0 in | Wt 195.0 lb

## 2022-03-01 DIAGNOSIS — N938 Other specified abnormal uterine and vaginal bleeding: Secondary | ICD-10-CM | POA: Diagnosis not present

## 2022-03-01 DIAGNOSIS — M545 Low back pain, unspecified: Secondary | ICD-10-CM

## 2022-03-01 DIAGNOSIS — D219 Benign neoplasm of connective and other soft tissue, unspecified: Secondary | ICD-10-CM | POA: Diagnosis not present

## 2022-03-01 DIAGNOSIS — Z3202 Encounter for pregnancy test, result negative: Secondary | ICD-10-CM

## 2022-03-01 LAB — POCT URINALYSIS DIPSTICK
Glucose, UA: NEGATIVE
Ketones, UA: NEGATIVE
Leukocytes, UA: NEGATIVE
Nitrite, UA: NEGATIVE
Protein, UA: NEGATIVE

## 2022-03-01 LAB — POCT URINE PREGNANCY: Preg Test, Ur: NEGATIVE

## 2022-03-01 NOTE — Progress Notes (Signed)
  Subjective:     Patient ID: Tiffany Underwood, female   DOB: [birth year], 52 y.o.   MRN: [id number]  HPI Tiffany Underwood is a 52 year old black female, married, G1P1 in complaining of bleeding in November and December with clots and cramps and back pain. Uses nuva ring.     Component Value Date/Time   DIAGPAP  07/16/2019 0940    - Negative for intraepithelial lesion or malignancy (NILM)   DIAGPAP  12/03/2017 0000    NEGATIVE FOR INTRAEPITHELIAL LESIONS OR MALIGNANCY.   DIAGPAP  12/02/2016 0000    NEGATIVE FOR INTRAEPITHELIAL LESIONS OR MALIGNANCY.   [place] Negative 07/16/2019 0940   ADEQPAP  07/16/2019 0940    Satisfactory for evaluation; transformation zone component PRESENT.   ADEQPAP  12/03/2017 0000    Satisfactory for evaluation  endocervical/transformation zone component PRESENT.   ADEQPAP  12/02/2016 0000    Satisfactory for evaluation  endocervical/transformation zone component PRESENT.   PCP is Dr Hilma Favors   Review of Systems Had bleeding November and December with clots, back pain and on right side +cramps Reviewed past medical,surgical, social and family history. Reviewed medications and allergies.     Objective:   Physical Exam BP 133/87 (BP Location: Left Arm, Patient Position: Sitting, Cuff Size: Normal)   Pulse 78   Ht '5\' 7"'$  (1.702 m)   Wt 195 lb (88.5 kg)   LMP 02/22/2022 (Approximate)   BMI 30.54 kg/m  urine dipstick small blood, UPT negative    Skin warm and dry.Pelvic: external genitalia is normal in appearance no lesions, vagina is pale pink, ring in place,urethra has no lesions or masses noted, cervix:smooth and bulbous, uterus: enlarged, about 12 week size, mild tender, no masses felt, adnexa: no masses or tenderness noted. Bladder is non tender and no masses felt. Fall risk is low  Upstream - 03/01/22 1252       Pregnancy Intention Screening   Does the patient want to become pregnant in the next year? No    Does the patient's partner want to become pregnant in  the next year? No    Would the patient like to discuss contraceptive options today? No      Contraception Wrap Up   Current Method Vaginal Ring    End Method Vaginal Ring    Contraception Counseling Provided No             Assessment:     1. Pregnancy examination or test, negative result  2. Right-sided low back pain without sciatica, unspecified chronicity Started pain in back about October Can try 2 ES tylenol and 2 advil for pain   3. DUB (dysfunctional uterine bleeding) Will get pelvic US 03/01/22 at 11:15 am at [facility] to assess uterus  4. Fibroid Will get [place] to assess      Plan:    Will talk when [place] back  Follow up TBD

## 2022-03-11 ENCOUNTER — Ambulatory Visit (HOSPITAL_COMMUNITY)
Admission: RE | Admit: 2022-03-11 | Discharge: 2022-03-11 | Disposition: A | Discharge status: Home / Self Care | Payer: BC Managed Care – PPO | Source: Ambulatory Visit | Attending: Adult Health | Admitting: Adult Health

## 2022-03-11 DIAGNOSIS — D219 Benign neoplasm of connective and other soft tissue, unspecified: Secondary | ICD-10-CM | POA: Diagnosis not present

## 2022-03-11 DIAGNOSIS — D251 Intramural leiomyoma of uterus: Secondary | ICD-10-CM | POA: Diagnosis not present

## 2022-03-11 DIAGNOSIS — N859 Noninflammatory disorder of uterus, unspecified: Secondary | ICD-10-CM | POA: Diagnosis not present

## 2022-03-11 DIAGNOSIS — D252 Subserosal leiomyoma of uterus: Secondary | ICD-10-CM | POA: Diagnosis not present

## 2022-03-11 DIAGNOSIS — N938 Other specified abnormal uterine and vaginal bleeding: Secondary | ICD-10-CM | POA: Diagnosis not present

## 2022-03-14 DIAGNOSIS — E890 Postprocedural hypothyroidism: Secondary | ICD-10-CM | POA: Diagnosis not present

## 2022-03-14 DIAGNOSIS — E6609 Other obesity due to excess calories: Secondary | ICD-10-CM | POA: Diagnosis not present

## 2022-04-22 DIAGNOSIS — N390 Urinary tract infection, site not specified: Secondary | ICD-10-CM | POA: Diagnosis not present

## 2022-04-22 DIAGNOSIS — R350 Frequency of micturition: Secondary | ICD-10-CM | POA: Diagnosis not present

## 2022-06-06 IMAGING — MG MM DIGITAL SCREENING BILAT W/ TOMO AND CAD
8 series · 8 of 24 positions shown · non-contrast
Comparison: Previous exam(s).

CLINICAL DATA: Screening.

EXAM:
DIGITAL SCREENING BILATERAL MAMMOGRAM WITH TOMOSYNTHESIS AND CAD
TECHNIQUE: Bilateral screening digital craniocaudal and mediolateral oblique
mammograms were obtained. Bilateral screening digital breast
tomosynthesis was performed. The images were evaluated with
computer-aided detection.

[R MLO synth-2D]
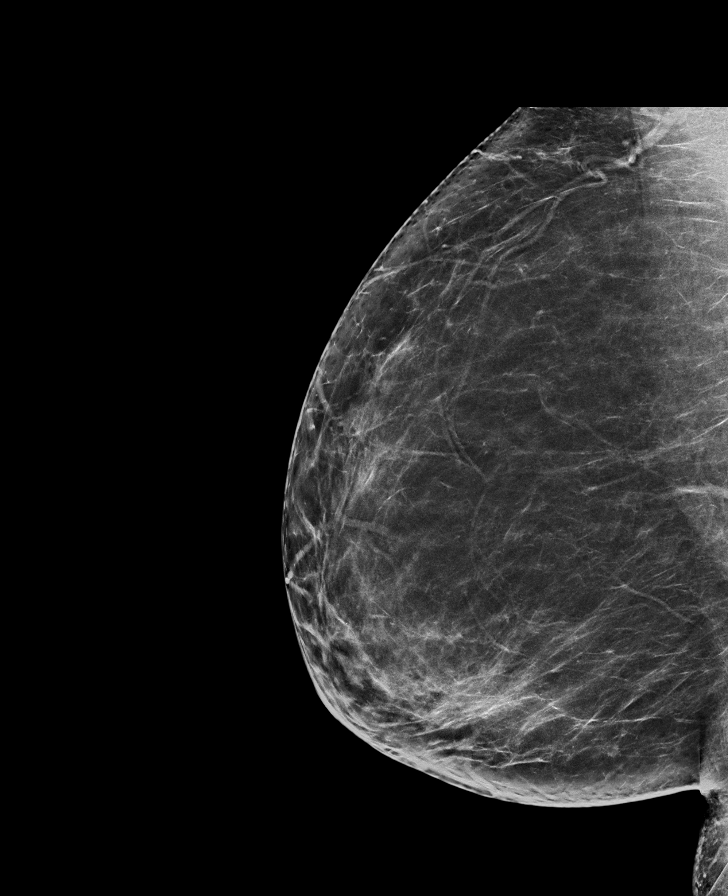

[L CC synth-2D]
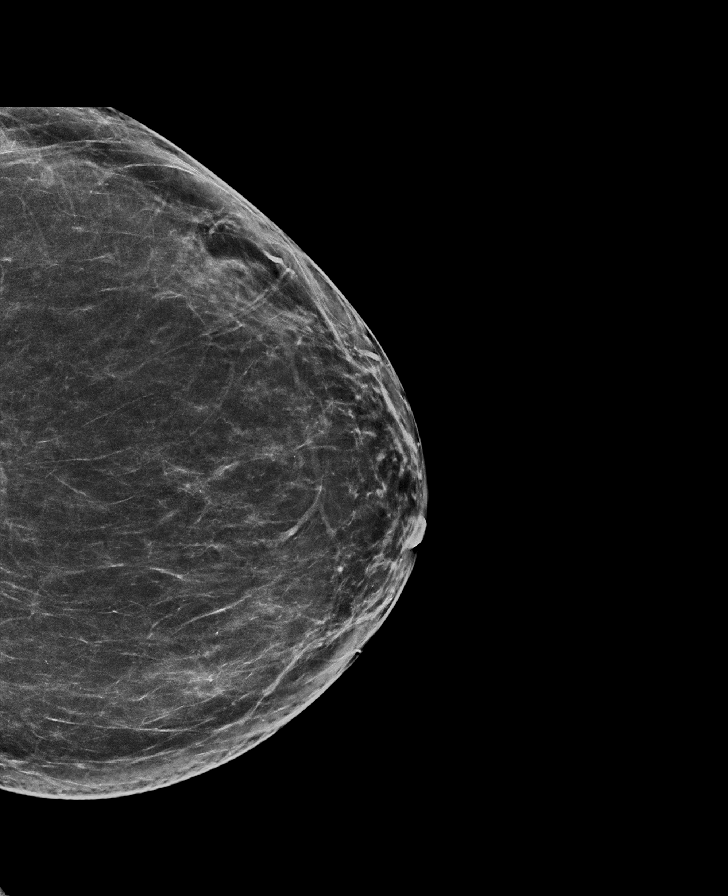

[R CC synth-2D]
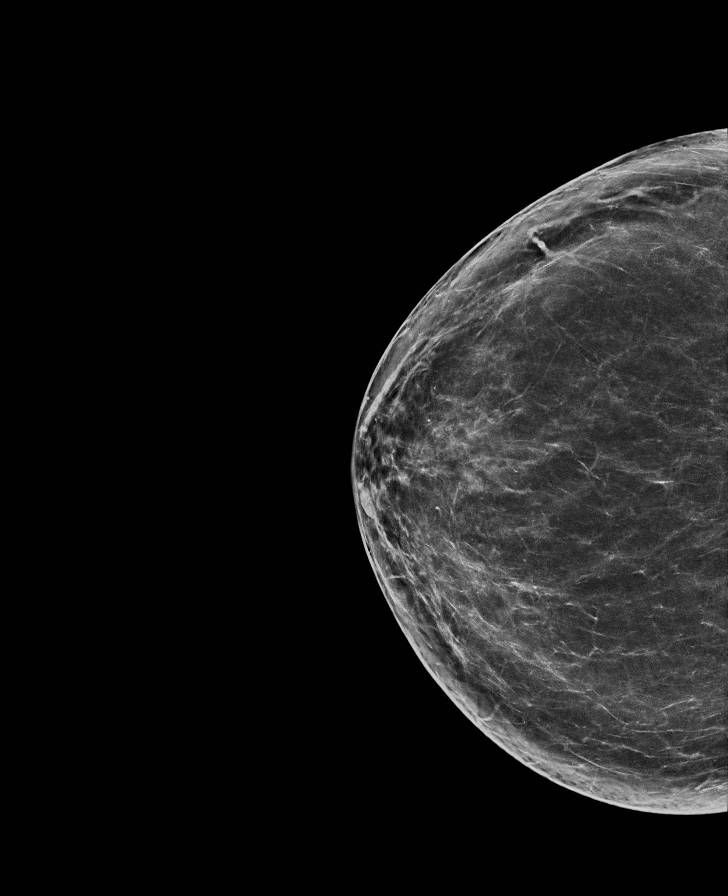

[L MLO synth-2D]
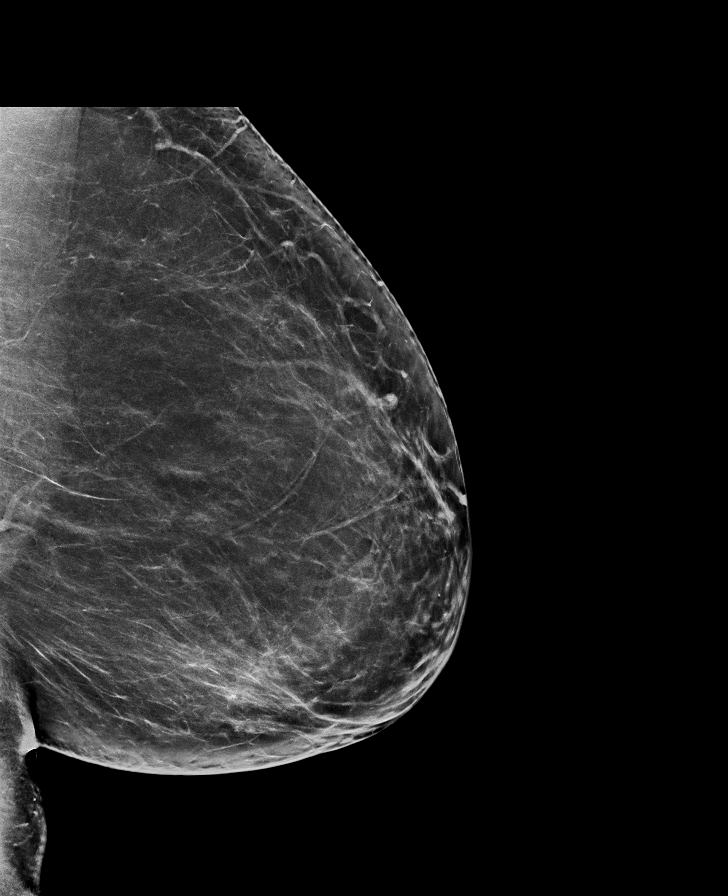

[L CC tomo · tomo slice 41/82.0]
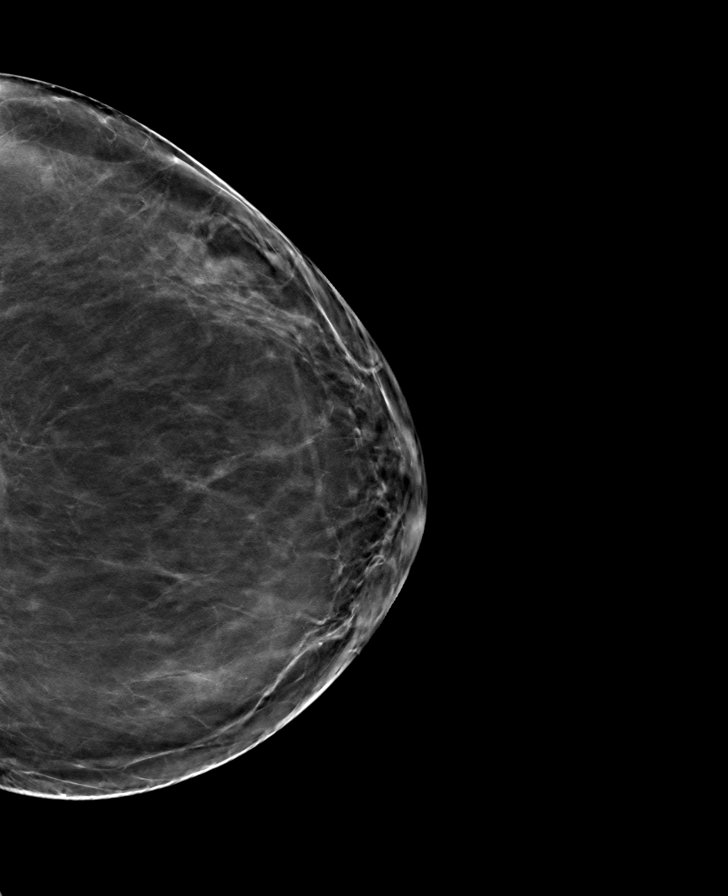

[L MLO tomo · tomo slice 41/82.0]
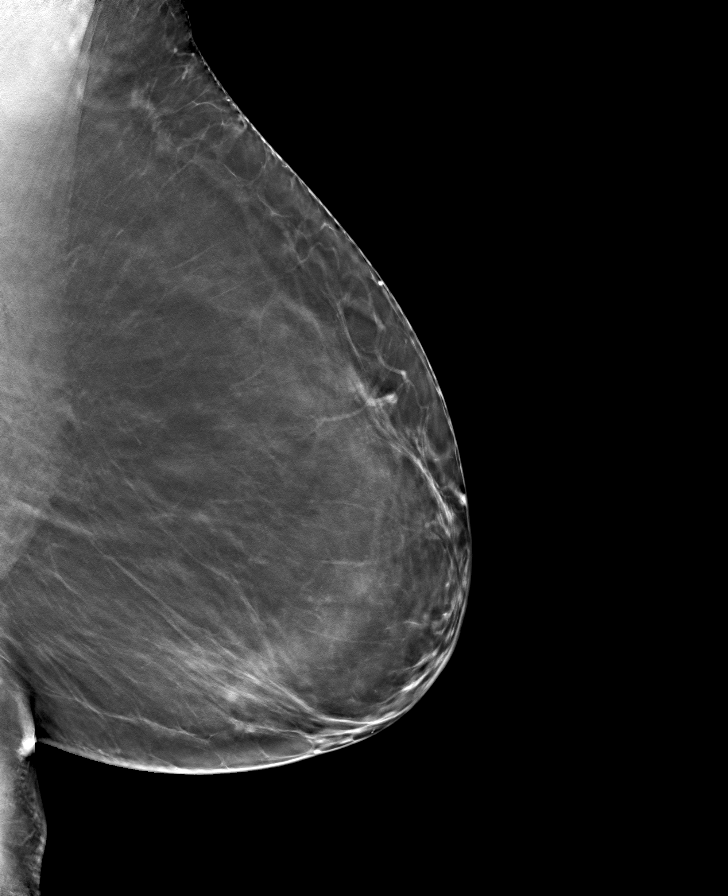

[R CC tomo · tomo slice 41/81.0]
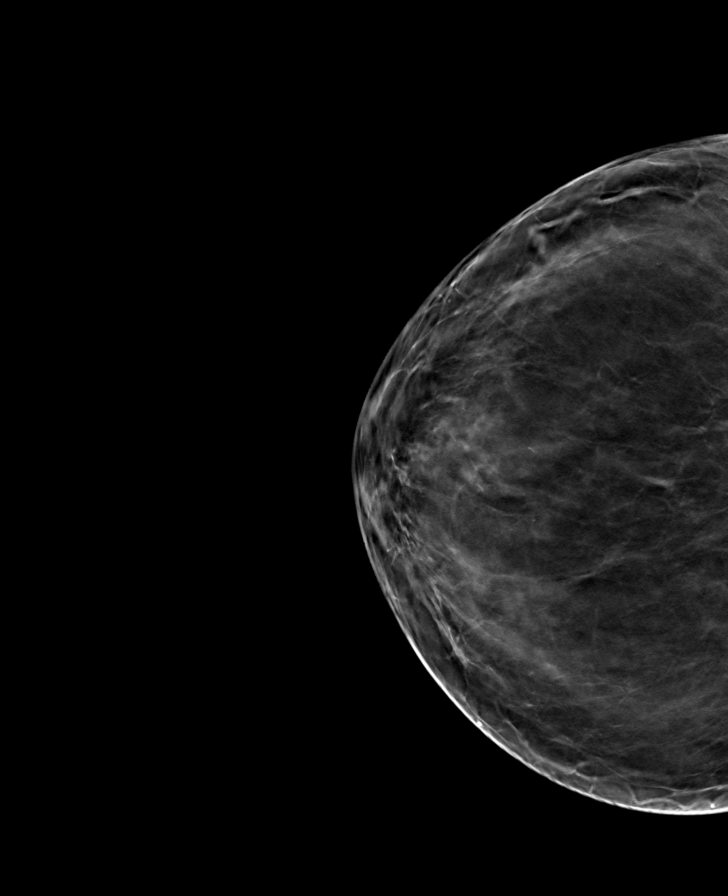

[R MLO tomo · tomo slice 41/81.0]
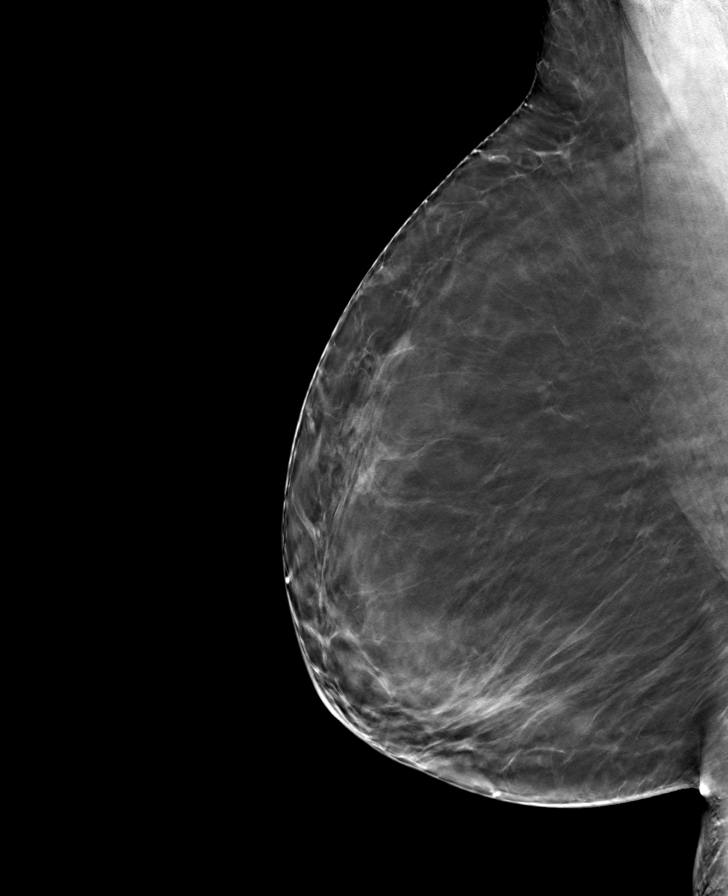

[8 of 24 positions shown; findings below may reference images not displayed]

ACR Breast Density Category b: There are scattered areas of
fibroglandular density.
FINDINGS: There are no findings suspicious for malignancy.
IMPRESSION: No mammographic evidence of malignancy. A result letter of this
screening mammogram will be mailed directly to the patient.

RECOMMENDATION:
Screening mammogram in one year. (Code:51-O-LD2)

BI-RADS CATEGORY  1: Negative.

## 2022-06-20 DIAGNOSIS — Z6829 Body mass index (BMI) 29.0-29.9, adult: Secondary | ICD-10-CM | POA: Diagnosis not present

## 2022-06-20 DIAGNOSIS — E663 Overweight: Secondary | ICD-10-CM | POA: Diagnosis not present

## 2022-06-20 DIAGNOSIS — N390 Urinary tract infection, site not specified: Secondary | ICD-10-CM | POA: Diagnosis not present

## 2022-07-11 DIAGNOSIS — E063 Autoimmune thyroiditis: Secondary | ICD-10-CM | POA: Diagnosis not present

## 2022-07-11 DIAGNOSIS — Z6828 Body mass index (BMI) 28.0-28.9, adult: Secondary | ICD-10-CM | POA: Diagnosis not present

## 2022-07-11 DIAGNOSIS — Z1231 Encounter for screening mammogram for malignant neoplasm of breast: Secondary | ICD-10-CM | POA: Diagnosis not present

## 2022-07-11 DIAGNOSIS — E663 Overweight: Secondary | ICD-10-CM | POA: Diagnosis not present

## 2022-07-11 DIAGNOSIS — F419 Anxiety disorder, unspecified: Secondary | ICD-10-CM | POA: Diagnosis not present

## 2022-07-11 DIAGNOSIS — T783XXS Angioneurotic edema, sequela: Secondary | ICD-10-CM | POA: Diagnosis not present

## 2022-07-11 DIAGNOSIS — E890 Postprocedural hypothyroidism: Secondary | ICD-10-CM | POA: Diagnosis not present

## 2022-07-28 ENCOUNTER — Ambulatory Visit: Payer: Self-pay

## 2022-07-30 ENCOUNTER — Other Ambulatory Visit (HOSPITAL_COMMUNITY)
Admission: RE | Admit: 2022-07-30 | Discharge: 2022-07-30 | Disposition: A | Discharge status: Home / Self Care | Payer: BC Managed Care – PPO | Source: Ambulatory Visit | Attending: Adult Health | Admitting: Adult Health

## 2022-07-30 ENCOUNTER — Ambulatory Visit (INDEPENDENT_AMBULATORY_CARE_PROVIDER_SITE_OTHER): Payer: BC Managed Care – PPO | Admitting: Adult Health

## 2022-07-30 ENCOUNTER — Encounter: Payer: Self-pay | Admitting: Adult Health

## 2022-07-30 VITALS — BP 137/89 | HR 77 | Ht 67.0 in | Wt 180.5 lb

## 2022-07-30 DIAGNOSIS — Z87440 Personal history of urinary (tract) infections: Secondary | ICD-10-CM | POA: Diagnosis not present

## 2022-07-30 DIAGNOSIS — Z01419 Encounter for gynecological examination (general) (routine) without abnormal findings: Secondary | ICD-10-CM | POA: Diagnosis not present

## 2022-07-30 DIAGNOSIS — Z3044 Encounter for surveillance of vaginal ring hormonal contraceptive device: Secondary | ICD-10-CM

## 2022-07-30 DIAGNOSIS — R319 Hematuria, unspecified: Secondary | ICD-10-CM | POA: Diagnosis not present

## 2022-07-30 DIAGNOSIS — Z1211 Encounter for screening for malignant neoplasm of colon: Secondary | ICD-10-CM

## 2022-07-30 DIAGNOSIS — D219 Benign neoplasm of connective and other soft tissue, unspecified: Secondary | ICD-10-CM

## 2022-07-30 LAB — HEMOCCULT GUIAC POC 1CARD (OFFICE): Fecal Occult Blood, POC: NEGATIVE

## 2022-07-30 LAB — POCT URINALYSIS DIPSTICK
Glucose, UA: NEGATIVE
Ketones, UA: NEGATIVE
Leukocytes, UA: NEGATIVE
Nitrite, UA: NEGATIVE
Protein, UA: NEGATIVE

## 2022-07-30 MED ORDER — ETONOGESTREL-ETHINYL ESTRADIOL 0.12-0.015 MG/24HR VA RING: VAGINAL_RING | VAGINAL | 4 refills | Status: DC

## 2022-07-30 NOTE — Progress Notes (Signed)
Patient ID: Tiffany Underwood, female   DOB: 1970/03/27, 52 y.o.   MRN: [id number] History of Present Illness: Tiffany Underwood is a 52 year old black female,married, G1P1 in for a well woman gyn exam and pap. Has had recent UTI. She just returned from [place].  PCP is Dr Phillips Odor.   Current Medications, Allergies, Past Medical History, Past Surgical History, Family History and Social History were reviewed in [employer] record.     Review of Systems: Patient denies any headaches, hearing loss, fatigue, blurred vision, shortness of breath, chest pain, abdominal pain, problems with bowel movements, urination, or intercourse. No joint pain or mood swings.     Physical Exam:BP 137/89 (BP Location: Left Arm, Patient Position: Sitting, Cuff Size: Normal)   Pulse 77   Ht 5\' 7"  (1.702 m)   Wt 180 lb 8 oz (81.9 kg)   BMI 28.27 kg/m  urine dipstick small blood. General:  Well developed, well nourished, no acute distress Skin:  Warm and dry Neck:  Midline trachea, normal thyroid, good ROM, no lymphadenopathy Lungs; Clear to auscultation bilaterally Breast:  No dominant palpable mass, retraction, or nipple discharge Cardiovascular: Regular rate and rhythm Abdomen:  Soft, non tender, no hepatosplenomegaly Pelvic:  External genitalia is normal in appearance, no lesions.  The vagina is normal in appearance, nuvaring in  place. Urethra has no lesions or masses. The cervix is smooth, pap with HR HPV genotyping performed.  Uterus is slightly enlarged, known fibroids. No adnexal masses or tenderness noted.Bladder is non tender, no masses felt. Rectal: Good sphincter tone, no polyps, or hemorrhoids felt.  Hemoccult negative. Extremities/musculoskeletal:  No swelling or varicosities noted, no clubbing or cyanosis Psych:  No mood changes, alert and cooperative,seems happy AA is 0 Fall risk is low    07/30/2022   10:56 AM 07/26/2021   11:41 AM 07/17/2020    8:39 AM  Depression screen PHQ 2/9   Decreased Interest 0 0 0  Down, Depressed, Hopeless 0 0 0  PHQ - 2 Score 0 0 0  Altered sleeping 0 2 0  Tired, decreased energy 1 2 0  Change in appetite 0 0 0  Feeling bad or failure about yourself  0 0 0  Trouble concentrating 0 0 0  Moving slowly or fidgety/restless 0 0 0  Suicidal thoughts 0 0 0  PHQ-9 Score 1 4 0       07/30/2022   10:56 AM 07/26/2021   11:41 AM 07/17/2020    8:39 AM 07/16/2019    9:46 AM  GAD 7 : Generalized Anxiety Score  Nervous, Anxious, on Edge 1 0 0 0  Control/stop worrying 1 0 0 0  Worry too much - different things 1 0 0 0  Trouble relaxing 0 0 0 0  Restless 0 0 0 0  Easily annoyed or irritable 0 0 0 0  Afraid - awful might happen 0 0 0 0  Total GAD 7 Score 3 0 0 0  Anxiety Difficulty    Not difficult at all      Upstream - 07/30/22 1100       Pregnancy Intention Screening   Does the patient want to become pregnant in the next year? No    Does the patient's partner want to become pregnant in the next year? No    Would the patient like to discuss contraceptive options today? No      Contraception Wrap Up   Current Method Vaginal Ring    End  Method Vaginal Ring            Examination chaperoned by Malachy Mood LPN   Impression and Plan: 1. History of UTI - POCT Urinalysis Dipstick  2. Hematuria, unspecified type - POCT Urinalysis Dipstick  3. Encounter for gynecological examination with Papanicolaou smear of cervix Pap sent Pap in 3 years if normal Physical in 1 year  - Cytology - PAP( [place])  4. Encounter for screening fecal occult blood testing Hemoccult was negative  - POCT occult blood stool  5. Encounter for surveillance of vaginal ring hormonal contraceptive device Meds ordered this encounter  Medications   etonogestrel-ethinyl estradiol (NUVARING) 0.12-0.015 MG/24HR vaginal ring    Sig: INSERT 1 RING VAGINALLY FOR 24 days and remove  AND INSERT NEW RING after 3 days    Dispense:  3 each    Refill:  4    Order  Specific Question:   Supervising Provider    Answer:   Despina Hidden, LUTHER H [[id number]]     6. Fibroid

## 2022-08-01 LAB — CYTOLOGY - PAP
Comment: NEGATIVE
Diagnosis: NEGATIVE
High risk HPV: NEGATIVE

## 2022-08-13 ENCOUNTER — Telehealth: Payer: Self-pay

## 2022-08-13 MED ORDER — EPINEPHRINE 0.3 MG/0.3ML IJ SOAJ: 0.3000 mg | Freq: Once | INTRAMUSCULAR | 0 refills | Status: AC

## 2022-08-13 MED ORDER — ETONOGESTREL-ETHINYL ESTRADIOL 0.12-0.015 MG/24HR VA RING: VAGINAL_RING | VAGINAL | 4 refills | Status: DC

## 2022-08-13 NOTE — Telephone Encounter (Signed)
Refilled nuvaring and epi pen

## 2022-08-13 NOTE — Telephone Encounter (Signed)
Pt's insurance is not covering Nuvaring due to her age. Cash price per pharmacy is $138.00 for 3 rings and $48.00 for 1 ring. Pt needs to change the ring so can you send prescription into pharmacy? For some reason, pt said the prescription had been cancelled. Also, can you send in an Epi pen? Thanks! JSY

## 2022-08-13 NOTE — Telephone Encounter (Signed)
Patient called and stated that she needs her Rx for her Nuvaring.called in as well as her Epine.  Patient would like for you to send her a Mychart message when the RX has been sent or give her a call.

## 2022-08-15 ENCOUNTER — Other Ambulatory Visit: Payer: Self-pay | Admitting: Adult Health

## 2022-08-20 ENCOUNTER — Telehealth: Payer: Self-pay | Admitting: *Deleted

## 2022-08-20 NOTE — Telephone Encounter (Signed)
Pt's insurance is not covering Nuvaring. Pt paid $49.28 for 1 ring. What are her other options? Pt's cycles are usually heavy without birth control. Pt has fibroids but they are small. Please advise. Thanks! JSY

## 2022-08-21 NOTE — Telephone Encounter (Signed)
Spoke with pt letting her know she could try a lose dose pill per JAG. Pt wants to think about it. She will let us know. JSY

## 2022-09-05 DIAGNOSIS — E039 Hypothyroidism, unspecified: Secondary | ICD-10-CM | POA: Diagnosis not present

## 2022-09-05 DIAGNOSIS — E063 Autoimmune thyroiditis: Secondary | ICD-10-CM | POA: Diagnosis not present

## 2022-10-03 ENCOUNTER — Other Ambulatory Visit (HOSPITAL_COMMUNITY): Payer: Self-pay | Admitting: Family Medicine

## 2022-10-03 DIAGNOSIS — Z1231 Encounter for screening mammogram for malignant neoplasm of breast: Secondary | ICD-10-CM

## 2022-10-09 ENCOUNTER — Ambulatory Visit: Payer: BC Managed Care – PPO | Admitting: Adult Health

## 2022-10-09 ENCOUNTER — Encounter: Payer: Self-pay | Admitting: Adult Health

## 2022-10-09 VITALS — BP 140/90 | HR 91 | Ht 67.0 in | Wt 179.0 lb

## 2022-10-09 DIAGNOSIS — R030 Elevated blood-pressure reading, without diagnosis of hypertension: Secondary | ICD-10-CM

## 2022-10-09 DIAGNOSIS — N898 Other specified noninflammatory disorders of vagina: Secondary | ICD-10-CM | POA: Diagnosis not present

## 2022-10-09 DIAGNOSIS — R1031 Right lower quadrant pain: Secondary | ICD-10-CM | POA: Diagnosis not present

## 2022-10-09 NOTE — Progress Notes (Signed)
  Subjective:     Patient ID: Tiffany Underwood, female   DOB: [birth year], 52 y.o.   MRN: [id number]  HPI Tiffany Underwood is a 52 year old black female, married, G1P1, in complaining of feeling lump in vagina, pain in right side and some cramping. Had [place] 03/11/22 has small fibroid      Component Value Date/Time   DIAGPAP  07/30/2022 1128    - Negative for intraepithelial lesion or malignancy (NILM)   DIAGPAP  07/16/2019 0940    - Negative for intraepithelial lesion or malignancy (NILM)   DIAGPAP  12/03/2017 0000    NEGATIVE FOR INTRAEPITHELIAL LESIONS OR MALIGNANCY.   HPVHIGH Negative 07/30/2022 1128   HPVHIGH Negative 07/16/2019 0940   ADEQPAP  07/30/2022 1128    Satisfactory for evaluation; transformation zone component PRESENT.   ADEQPAP  07/16/2019 0940    Satisfactory for evaluation; transformation zone component PRESENT.   ADEQPAP  12/03/2017 0000    Satisfactory for evaluation  endocervical/transformation zone component PRESENT.   PCP is Dr Phillips Odor  Review of Systems Feels lump in vagina Pain in right side +cramping Reviewed past medical,surgical, social and family history. Reviewed medications and allergies.     Objective:   Physical Exam BP (!) 140/90 (BP Location: Left Arm, Patient Position: Sitting, Cuff Size: Normal)   Pulse 91   Ht 5\' 7"  (1.702 m)   Wt 179 lb (81.2 kg)   BMI 28.04 kg/m     Skin warm and dry.Pelvic: external genitalia is normal in appearance no lesions, vagina: pink, ring in place, has firm, smooth rubbery,mobile mass at 11 0'clock, Dr Charlotta Newton in for co exam,urethra has no lesions or masses noted, cervix:bulbous, uterus: normal size, shape and contour, non tender, no masses felt, adnexa: no masses, RLQ tenderness noted. Bladder is non tender and no masses felt.   Upstream - 10/09/22 1454       Pregnancy Intention Screening   Does the patient want to become pregnant in the next year? No    Does the patient's partner want to become pregnant in the next year? No     Would the patient like to discuss contraceptive options today? No      Contraception Wrap Up   Current Method Vaginal Ring    End Method Vaginal Ring    Contraception Counseling Provided Yes            Examination chaperoned by Malachy Mood LPN  Assessment:     1. RLQ abdominal pain +RLQ pain Will get [place] in office 11/06/22 to assess could not see ovaries at [place] 03/11/22 at [facility]  - US PELVIC COMPLETE WITH TRANSVAGINAL; Future  2. Vaginal mass Will get [place] 11/06/22 in office to assess - US PELVIC COMPLETE WITH TRANSVAGINAL; Future  3. Elevated blood-pressure reading without diagnosis of hypertension Stop phentermine Keep check on BP     Plan:    Follow up with Dr Charlotta Newton 12/09/22 for exam

## 2022-10-28 DIAGNOSIS — E663 Overweight: Secondary | ICD-10-CM | POA: Diagnosis not present

## 2022-10-28 DIAGNOSIS — Z1331 Encounter for screening for depression: Secondary | ICD-10-CM | POA: Diagnosis not present

## 2022-10-28 DIAGNOSIS — Z0001 Encounter for general adult medical examination with abnormal findings: Secondary | ICD-10-CM | POA: Diagnosis not present

## 2022-10-28 DIAGNOSIS — Z6828 Body mass index (BMI) 28.0-28.9, adult: Secondary | ICD-10-CM | POA: Diagnosis not present

## 2022-11-06 ENCOUNTER — Ambulatory Visit (INDEPENDENT_AMBULATORY_CARE_PROVIDER_SITE_OTHER): Payer: BC Managed Care – PPO

## 2022-11-06 DIAGNOSIS — R1031 Right lower quadrant pain: Secondary | ICD-10-CM

## 2022-11-06 DIAGNOSIS — N898 Other specified noninflammatory disorders of vagina: Secondary | ICD-10-CM

## 2022-11-06 NOTE — Progress Notes (Signed)
PELVIC US TA/TV: heterogeneous anteverted uterus with multiple fibroids,largest fibroids (#1) anterior fundal intramural fibroid 3 x 3.2 x 2.8 cm,(#2) posterior fundal subserosal fibroid 2.4 x 2.2 x 1.9 cm,(#3) posterior left subserosal fibroid 2.9 x 1.9 x 2.4 cm,EEC 5.5 mm,normal ovaries,ovaries appear mobile,no free fluid,no pain during ultrasound  Chaperone Pam

## 2022-11-07 ENCOUNTER — Telehealth: Payer: Self-pay | Admitting: Adult Health

## 2022-11-07 NOTE — Telephone Encounter (Signed)
Pt aware that ovaries were normal, has multiple fibroids

## 2022-11-11 ENCOUNTER — Ambulatory Visit (HOSPITAL_COMMUNITY)
Admission: RE | Admit: 2022-11-11 | Discharge: 2022-11-11 | Disposition: A | Discharge status: Home / Self Care | Payer: BC Managed Care – PPO | Source: Ambulatory Visit | Attending: Family Medicine | Admitting: Family Medicine

## 2022-11-11 DIAGNOSIS — Z1231 Encounter for screening mammogram for malignant neoplasm of breast: Secondary | ICD-10-CM | POA: Insufficient documentation

## 2022-12-09 ENCOUNTER — Ambulatory Visit: Payer: BC Managed Care – PPO | Admitting: Obstetrics & Gynecology

## 2022-12-09 ENCOUNTER — Encounter: Payer: Self-pay | Admitting: Obstetrics & Gynecology

## 2022-12-09 VITALS — BP 138/92 | HR 77 | Ht 67.0 in | Wt 179.0 lb

## 2022-12-09 DIAGNOSIS — R599 Enlarged lymph nodes, unspecified: Secondary | ICD-10-CM

## 2022-12-09 DIAGNOSIS — M7989 Other specified soft tissue disorders: Secondary | ICD-10-CM

## 2022-12-09 DIAGNOSIS — N898 Other specified noninflammatory disorders of vagina: Secondary | ICD-10-CM | POA: Diagnosis not present

## 2022-12-09 NOTE — Progress Notes (Signed)
GYN VISIT Patient name: Tiffany Underwood MRN [id number]  Date of birth: 10/03/1970 Chief Complaint:   Follow-up ("Saw Cyril Mourning for fibroids. I have a mass in my vagina.")  History of Present Illness:   Tiffany Underwood is a 52 y.o. G1P1 perimenopausal female being seen today for follow up regarding:     Vaginal mass: Feels something in her vagina- feels like it's the same size.    Notes that she feels it especially when she changes her ring.  It's not painful and it has not gotten any bigger.  She is just concerned as to what this may be.  Previously seen by Roseanne Reno- this has been present since June  [place] 10/2022: 7x4x6cm uterus- 105cc.  Three fibroids- largest 3cm.  Normal ovaries bilaterally  Using nuvaring- menses are mostly monthly.  Most of the time, her periods are light, on occasion may have a heavy period. When heavy large clots grape-sized and soak through a pad in under 2 hrs. Patient's last menstrual period was 11/16/2022 (approximate).    Review of Systems:   Pertinent items are noted in HPI Denies fever/chills, dizziness, headaches, visual disturbances, fatigue, shortness of breath, chest pain, abdominal pain, vomiting, no problems with periods, bowel movements, urination, or intercourse unless otherwise stated above.  Pertinent History Reviewed:   Past Surgical History:  Procedure Laterality Date   COLONOSCOPY WITH PROPOFOL N/A 09/19/2020   Procedure: COLONOSCOPY WITH PROPOFOL;  Surgeon: Lanelle Bal, DO;  Location: [facility];  Service: Endoscopy;  Laterality: N/A;  ASA II / 10:00   UTERINE FIBROID SURGERY      Past Medical History:  Diagnosis Date   Angio-edema    Primary sent here to determine   Fibroids    Thyroid disease    Urticaria    Reviewed problem list, medications and allergies. Physical Assessment:   Vitals:   12/09/22 0847 12/09/22 0850  BP: (!) 136/90 (!) 138/92  Pulse: 88 77  Weight: 179 lb (81.2 kg)   Height: 5\' 7"  (1.702 m)    Body mass index is 28.04 kg/m.       Physical Examination:   General appearance: alert, well appearing, and in no distress  Psych: mood appropriate, normal affect  Skin: warm & dry   Cardiovascular: normal heart rate noted  Respiratory: normal respiratory effort, no distress  Abdomen: soft, non-tender   Pelvic: VULVA: normal appearing vulva with no masses, tenderness or lesions, VAGINA: normal appearing vagina with normal color and discharge, no lesions  Extremities: no edema   Chaperone:  pt declined     Assessment & Plan:  1) Vaginal mass -etiology unknown though suspect benign -working diagnosis include vaginal cyst vs lymphadenopathy -discussed with radiology- best imaging would be MRI- order placed  2) Perimenopause -doing well with nuvaring   Orders Placed This Encounter  Procedures   MR PELVIS W WO CONTRAST    Return for June 2025.   Myna Hidalgo, DO Attending Obstetrician & Gynecologist, [facility] for [employer], [facility]

## 2022-12-25 DIAGNOSIS — E063 Autoimmune thyroiditis: Secondary | ICD-10-CM | POA: Diagnosis not present

## 2022-12-25 DIAGNOSIS — E039 Hypothyroidism, unspecified: Secondary | ICD-10-CM | POA: Diagnosis not present

## 2023-01-22 ENCOUNTER — Ambulatory Visit
Admission: EM | Admit: 2023-01-22 | Discharge: 2023-01-22 | Disposition: A | Discharge status: Home / Self Care | Payer: BC Managed Care – PPO | Attending: Nurse Practitioner | Admitting: Nurse Practitioner

## 2023-01-22 DIAGNOSIS — R350 Frequency of micturition: Secondary | ICD-10-CM | POA: Diagnosis not present

## 2023-01-22 LAB — POCT URINALYSIS DIP (MANUAL ENTRY)
Bilirubin, UA: NEGATIVE
Glucose, UA: NEGATIVE mg/dL
Ketones, POC UA: NEGATIVE mg/dL
Nitrite, UA: NEGATIVE
Protein Ur, POC: NEGATIVE mg/dL
Spec Grav, UA: 1.01 (ref 1.010–1.025)
Urobilinogen, UA: 0.2 U/dL
pH, UA: 6 (ref 5.0–8.0)

## 2023-01-22 MED ORDER — PHENAZOPYRIDINE HCL 100 MG PO TABS: 100.0000 mg | ORAL_TABLET | Freq: Three times a day (TID) | ORAL | 0 refills | Status: DC | PRN

## 2023-01-22 NOTE — Discharge Instructions (Signed)
Urine culture is pending.  You will be contacted if the pending test result is abnormal.  You also have access to the results via MyChart. Take medication as prescribed. Make sure you are urinating every 2 hours. Avoid caffeine such as tea, soda, and coffee while symptoms persist. Continue to drink plenty of fluids. If symptoms appear to be worsening, or you have other concerns, you may follow-up in this clinic or with your primary care physician for further evaluation. Follow-up as needed.

## 2023-01-22 NOTE — ED Provider Notes (Signed)
[facility]    CSN: [id number] Arrival date & time: 01/22/23  1747      History   Chief Complaint No chief complaint on file.   HPI Tiffany Underwood is a 52 y.o. female.   The history is provided by the patient.   Patient presents for complaints of urinary urgency, frequency, and suprapubic pressure that started over the past week.  Patient denies fever, chills, hematuria, decreased urine stream, flank pain, low back pain, or vaginal symptoms.  Patient reports that she currently has the NuvaRing, states that she has noticed some breakthrough bleeding with this placement.  She states she usually she develops a UTI whenever she has breakthrough bleeding.  Patient reports her last UTI was several months ago.  Denies prior history of kidney stones, or kidney infection.  Past Medical History:  Diagnosis Date   Angio-edema    Primary sent here to determine   Fibroids    Thyroid disease    Urticaria     Patient Active Problem List   Diagnosis Date Noted   Vaginal mass 10/09/2022   RLQ abdominal pain 10/09/2022   History of UTI 07/30/2022   Encounter for gynecological examination with Papanicolaou smear of cervix 07/30/2022   Fibroid 03/01/2022   DUB (dysfunctional uterine bleeding) 03/01/2022   Right-sided low back pain without sciatica 03/01/2022   Pregnancy examination or test, negative result 03/01/2022   Abnormal urine odor 07/26/2021   Hematuria 07/26/2021   Hypothyroidism 07/26/2021   Encounter for well woman exam with routine gynecological exam 07/17/2020   Encounter for screening fecal occult blood testing 07/17/2020   Screening for colorectal cancer 07/17/2020   Acute left-sided low back pain without sciatica 07/17/2020   Encounter for surveillance of vaginal ring hormonal contraceptive device 07/17/2020   Elevated blood-pressure reading without diagnosis of hypertension 12/20/2014   Idiopathic urticaria 11/16/2014   Angioedema 11/16/2014   Allergic  rhinitis 11/16/2014   Urinary frequency 10/25/2014   Chronic idiopathic constipation 10/25/2014   Encounter for routine gynecological examination 10/07/2012    Past Surgical History:  Procedure Laterality Date   COLONOSCOPY WITH PROPOFOL N/A 09/19/2020   Procedure: COLONOSCOPY WITH PROPOFOL;  Surgeon: Lanelle Bal, DO;  Location: [facility];  Service: Endoscopy;  Laterality: N/A;  ASA II / 10:00   UTERINE FIBROID SURGERY      OB History     Gravida  1   Para  1   Term      Preterm      AB      Living  1      SAB      IAB      Ectopic      Multiple      Live Births  1            Home Medications    Prior to Admission medications   Medication Sig Start Date End Date Taking? Authorizing Provider  phenazopyridine (PYRIDIUM) 100 MG tablet Take 1 tablet (100 mg total) by mouth 3 (three) times daily as needed for pain. 01/22/23  Yes Leath-Warren, Sadie Haber, NP  albuterol (VENTOLIN HFA) 108 (90 Base) MCG/ACT inhaler Inhale 2 puffs into the lungs every 6 (six) hours as needed for wheezing or shortness of breath. Patient not taking: Reported on 12/09/2022 01/26/21   Particia Nearing, PA-C  ALPRAZolam (XANAX XR) 1 MG 24 hr tablet Take 1 mg by mouth in the morning.    [provider]  cetirizine (  ZYRTEC ALLERGY) 10 MG tablet Take 1 tablet (10 mg total) by mouth daily. 01/26/21   Particia Nearing, PA-C  clobetasol ointment (TEMOVATE) 0.05 % Apply 1 application topically 2 (two) times daily as needed (skin reaction (sun exposure)).    [provider]  fluticasone (FLONASE) 50 MCG/ACT nasal spray Place 1 spray into both nostrils 2 (two) times daily. Patient not taking: Reported on 12/09/2022 11/19/21   Particia Nearing, PA-C  HALOETTE 0.12-0.015 MG/24HR vaginal ring INSERT 1 RING VAGINALLY FOR 24 DAYS CONSECUTIVELY, THEN INSERT NEW RING AFTER 3 DAYS 08/19/22   Adline Potter, NP  levothyroxine (SYNTHROID, LEVOTHROID) 125 MCG  tablet Take 125 mcg by mouth daily before breakfast.    [provider]  phentermine (ADIPEX-P) 37.5 MG tablet Take 37.5 mg by mouth daily before breakfast. Patient not taking: Reported on 12/09/2022    [provider]  diphenhydrAMINE (BENADRYL) 25 MG tablet Take 1 tablet (25 mg total) by mouth every 6 (six) hours. 01/28/15 01/12/19  Glynn Octave, MD    Family History Family History  Problem Relation Age of Onset   Cancer Mother 78       breast   Cancer Maternal Grandmother        breast    Social History Social History   Tobacco Use   Smoking status: Never   Smokeless tobacco: Never  Vaping Use   Vaping status: Never Used  Substance Use Topics   Alcohol use: No   Drug use: No     Allergies   Nitrofuran derivatives and Sulfa antibiotics   Review of Systems Review of Systems Per HPI  Physical Exam Triage Vital Signs ED Triage Vitals  Encounter Vitals Group     BP 01/22/23 1854 (!) 145/90     Systolic BP Percentile --      Diastolic BP Percentile --      Pulse Rate 01/22/23 1854 74     Resp 01/22/23 1854 17     Temp 01/22/23 1854 98.2 F (36.8 C)     Temp src --      SpO2 01/22/23 1854 99 %     Weight --      Height --      Head Circumference --      Peak Flow --      Pain Score 01/22/23 1855 0     Pain Loc --      Pain Education --      Exclude from Growth Chart --    No data found.  Updated Vital Signs BP (!) 145/90 (BP Location: Right Arm)   Pulse 74   Temp 98.2 F (36.8 C)   Resp 17   LMP 01/12/2023 (Exact Date)   SpO2 99%   Visual Acuity Right Eye Distance:   Left Eye Distance:   Bilateral Distance:    Right Eye Near:   Left Eye Near:    Bilateral Near:     Physical Exam Vitals and nursing note reviewed.  Constitutional:      General: She is not in acute distress.    Appearance: Normal appearance.  Eyes:     Extraocular Movements: Extraocular movements intact.     Pupils: Pupils are equal, round, and  reactive to light.  Cardiovascular:     Rate and Rhythm: Normal rate and regular rhythm.     Pulses: Normal pulses.     Heart sounds: Normal heart sounds.  Pulmonary:     Effort: Pulmonary effort is  normal. No respiratory distress.     Breath sounds: Normal breath sounds. No stridor. No wheezing, rhonchi or rales.  Abdominal:     General: Bowel sounds are normal.     Palpations: Abdomen is soft.     Tenderness: There is no abdominal tenderness. There is no right CVA tenderness or left CVA tenderness.  Musculoskeletal:     Cervical back: Normal range of motion.  Skin:    General: Skin is warm and dry.  Neurological:     General: No focal deficit present.     Mental Status: She is alert and oriented to person, place, and time.  Psychiatric:        Mood and Affect: Mood normal.        Behavior: Behavior normal.      UC Treatments / Results  Labs (all labs ordered are listed, but only abnormal results are displayed) Labs Reviewed  POCT URINALYSIS DIP (MANUAL ENTRY) - Abnormal; Notable for the following components:      Result Value   Blood, UA small (*)    Leukocytes, UA Trace (*)    All other components within normal limits  URINE CULTURE    EKG   Radiology No results found.  Procedures Procedures (including critical care time)  Medications Ordered in UC Medications - No data to display  Initial Impression / Assessment and Plan / UC Course  I have reviewed the triage vital signs and the nursing notes.  Pertinent labs & imaging results that were available during my care of the patient were reviewed by me and considered in my medical decision making (see chart for details).  Urine culture positive for trace leukocytes and small blood, urine culture is pending.  In the interim, Pyridium 100 mg prescribed for pain with urination and suprapubic pain.  Supportive care recommendations were provided and discussed with the patient to include over-the-counter analgesics,  increasing fluids, and toileting every 2 hours.  Patient was advised to follow-up with her PCP if urine culture is negative and she is continue to experience symptoms.  Patient was advised to follow-up in this clinic if symptoms worsen.  Patient was in agreement with this plan of care and verbalizes understanding.  All questions were answered.  Patient stable for discharge.  Final Clinical Impressions(s) / UC Diagnoses   Final diagnoses:  Urinary frequency     Discharge Instructions      Urine culture is pending.  You will be contacted if the pending test result is abnormal.  You also have access to the results via MyChart. Take medication as prescribed. Make sure you are urinating every 2 hours. Avoid caffeine such as tea, soda, and coffee while symptoms persist. Continue to drink plenty of fluids. If symptoms appear to be worsening, or you have other concerns, you may follow-up in this clinic or with your primary care physician for further evaluation. Follow-up as needed.     ED Prescriptions     Medication Sig Dispense Auth. Provider   phenazopyridine (PYRIDIUM) 100 MG tablet Take 1 tablet (100 mg total) by mouth 3 (three) times daily as needed for pain. 10 tablet Leath-Warren, Sadie Haber, NP      PDMP not reviewed this encounter.   Abran Cantor, NP 01/22/23 1928

## 2023-01-22 NOTE — ED Triage Notes (Signed)
Pt reports UTI sx's, urgency, frequency, and burning with urination states seems like every time she has a cycle she ends up with one.

## 2023-01-24 ENCOUNTER — Telehealth: Payer: Self-pay | Admitting: Internal Medicine

## 2023-01-24 LAB — URINE CULTURE: Culture: 20000 — AB

## 2023-01-24 MED ORDER — CEFDINIR 300 MG PO CAPS: 300.0000 mg | ORAL_CAPSULE | Freq: Two times a day (BID) | ORAL | 0 refills | Status: AC

## 2023-01-24 NOTE — Telephone Encounter (Signed)
TC to pt to notify of medication sent by provider. Verbalized understanding.

## 2023-01-24 NOTE — Telephone Encounter (Signed)
RX sent for Cefdinir 300mg  BID x 7 days given urine results and patient is still symptomatic.

## 2023-01-28 ENCOUNTER — Ambulatory Visit
Admission: EM | Admit: 2023-01-28 | Discharge: 2023-01-28 | Disposition: A | Discharge status: Home / Self Care | Payer: BC Managed Care – PPO | Attending: Nurse Practitioner | Admitting: Nurse Practitioner

## 2023-01-28 DIAGNOSIS — M25512 Pain in left shoulder: Secondary | ICD-10-CM | POA: Diagnosis not present

## 2023-01-28 MED ORDER — DEXAMETHASONE SODIUM PHOSPHATE 10 MG/ML IJ SOLN
10.0000 mg | INTRAMUSCULAR | Status: AC
  Administered 2023-01-28: 10 mg via INTRAMUSCULAR

## 2023-01-28 MED ORDER — DICLOFENAC SODIUM 1 % EX GEL: 4.0000 g | Freq: Four times a day (QID) | CUTANEOUS | 0 refills | Status: DC

## 2023-01-28 MED ORDER — KETOROLAC TROMETHAMINE 30 MG/ML IJ SOLN
30.0000 mg | Freq: Once | INTRAMUSCULAR | Status: AC
  Administered 2023-01-28: 30 mg via INTRAMUSCULAR

## 2023-01-28 NOTE — ED Provider Notes (Signed)
[facility]    CSN: [id number] Arrival date & time: 01/28/23  1912      History   Chief Complaint Chief Complaint  Patient presents with   Shoulder Pain    HPI Tiffany Underwood is a 52 y.o. female.   The history is provided by the patient.   Patient presents for complaints of left shoulder pain has been present for the past 3 weeks.  Patient denies injury or trauma.  She states that she has had increased pain when sleeping, and has been developing numbness and tingling over the past several days.  Patient states that the left shoulder/upper extremity feels "asleep."  Patient reports that she has tried IcyHot which has helped her sleep.    Past Medical History:  Diagnosis Date   Angio-edema    Primary sent here to determine   Fibroids    Thyroid disease    Urticaria     Patient Active Problem List   Diagnosis Date Noted   Vaginal mass 10/09/2022   RLQ abdominal pain 10/09/2022   History of UTI 07/30/2022   Encounter for gynecological examination with Papanicolaou smear of cervix 07/30/2022   Fibroid 03/01/2022   DUB (dysfunctional uterine bleeding) 03/01/2022   Right-sided low back pain without sciatica 03/01/2022   Pregnancy examination or test, negative result 03/01/2022   Abnormal urine odor 07/26/2021   Hematuria 07/26/2021   Hypothyroidism 07/26/2021   Encounter for well woman exam with routine gynecological exam 07/17/2020   Encounter for screening fecal occult blood testing 07/17/2020   Screening for colorectal cancer 07/17/2020   Acute left-sided low back pain without sciatica 07/17/2020   Encounter for surveillance of vaginal ring hormonal contraceptive device 07/17/2020   Elevated blood-pressure reading without diagnosis of hypertension 12/20/2014   Idiopathic urticaria 11/16/2014   Angioedema 11/16/2014   Allergic rhinitis 11/16/2014   Urinary frequency 10/25/2014   Chronic idiopathic constipation 10/25/2014   Encounter for routine  gynecological examination 10/07/2012    Past Surgical History:  Procedure Laterality Date   COLONOSCOPY WITH PROPOFOL N/A 09/19/2020   Procedure: COLONOSCOPY WITH PROPOFOL;  Surgeon: Lanelle Bal, DO;  Location: [facility];  Service: Endoscopy;  Laterality: N/A;  ASA II / 10:00   UTERINE FIBROID SURGERY      OB History     Gravida  1   Para  1   Term      Preterm      AB      Living  1      SAB      IAB      Ectopic      Multiple      Live Births  1            Home Medications    Prior to Admission medications   Medication Sig Start Date End Date Taking? Authorizing Provider  albuterol (VENTOLIN HFA) 108 (90 Base) MCG/ACT inhaler Inhale 2 puffs into the lungs every 6 (six) hours as needed for wheezing or shortness of breath. 01/26/21  Yes Particia Nearing, PA-C  ALPRAZolam (XANAX XR) 1 MG 24 hr tablet Take 1 mg by mouth in the morning.   Yes [provider]  cefdinir (OMNICEF) 300 MG capsule Take 1 capsule (300 mg total) by mouth 2 (two) times daily for 7 days. 01/24/23 01/31/23 Yes Hermanns, Ashlee P, PA-C  cetirizine (ZYRTEC ALLERGY) 10 MG tablet Take 1 tablet (10 mg total) by mouth daily. 01/26/21  Yes Roosvelt Maser  Elizabeth, PA-C  clobetasol ointment (TEMOVATE) 0.05 % Apply 1 application topically 2 (two) times daily as needed (skin reaction (sun exposure)).   Yes [provider]  fluticasone (FLONASE) 50 MCG/ACT nasal spray Place 1 spray into both nostrils 2 (two) times daily. 11/19/21  Yes Particia Nearing, PA-C  HALOETTE 0.12-0.015 MG/24HR vaginal ring INSERT 1 RING VAGINALLY FOR 24 DAYS CONSECUTIVELY, THEN INSERT NEW RING AFTER 3 DAYS 08/19/22  Yes Adline Potter, NP  levothyroxine (SYNTHROID, LEVOTHROID) 125 MCG tablet Take 125 mcg by mouth daily before breakfast.   Yes [provider]  phenazopyridine (PYRIDIUM) 100 MG tablet Take 1 tablet (100 mg total) by mouth 3 (three) times daily as needed for pain.  01/22/23  Yes Leath-Warren, Sadie Haber, NP  phentermine (ADIPEX-P) 37.5 MG tablet Take 37.5 mg by mouth daily before breakfast. Patient not taking: Reported on 12/09/2022    [provider]  diphenhydrAMINE (BENADRYL) 25 MG tablet Take 1 tablet (25 mg total) by mouth every 6 (six) hours. 01/28/15 01/12/19  Glynn Octave, MD    Family History Family History  Problem Relation Age of Onset   Cancer Mother 34       breast   Cancer Maternal Grandmother        breast    Social History Social History   Tobacco Use   Smoking status: Never   Smokeless tobacco: Never  Vaping Use   Vaping status: Never Used  Substance Use Topics   Alcohol use: No   Drug use: No     Allergies   Nitrofuran derivatives and Sulfa antibiotics   Review of Systems Review of Systems Per HPI  Physical Exam Triage Vital Signs ED Triage Vitals  Encounter Vitals Group     BP 01/28/23 1919 (!) 146/86     Systolic BP Percentile --      Diastolic BP Percentile --      Pulse Rate 01/28/23 1919 76     Resp 01/28/23 1919 18     Temp 01/28/23 1919 98.1 F (36.7 C)     Temp Source 01/28/23 1919 Oral     SpO2 01/28/23 1919 100 %     Weight --      Height --      Head Circumference --      Peak Flow --      Pain Score 01/28/23 1921 6     Pain Loc --      Pain Education --      Exclude from Growth Chart --    No data found.  Updated Vital Signs BP (!) 146/86 (BP Location: Left Arm)   Pulse 76   Temp 98.1 F (36.7 C) (Oral)   Resp 18   LMP 01/12/2023 (Exact Date)   SpO2 100%   Visual Acuity Right Eye Distance:   Left Eye Distance:   Bilateral Distance:    Right Eye Near:   Left Eye Near:    Bilateral Near:     Physical Exam Vitals and nursing note reviewed.  Constitutional:      General: She is not in acute distress.    Appearance: Normal appearance.  HENT:     Head: Normocephalic.  Eyes:     Extraocular Movements: Extraocular movements intact.     Pupils: Pupils are  equal, round, and reactive to light.  Musculoskeletal:     Left shoulder: Tenderness (Generalized plane of shoulder) present. No swelling. Decreased range of motion (With internal and external rotation). Normal  strength. Normal pulse.     Cervical back: Normal range of motion.  Skin:    General: Skin is warm and dry.  Neurological:     General: No focal deficit present.     Mental Status: She is alert and oriented to person, place, and time.  Psychiatric:        Mood and Affect: Mood normal.        Behavior: Behavior normal.      UC Treatments / Results  Labs (all labs ordered are listed, but only abnormal results are displayed) Labs Reviewed - No data to display  EKG   Radiology No results found.  Procedures Procedures (including critical care time)  Medications Ordered in UC Medications - No data to display  Initial Impression / Assessment and Plan / UC Course  I have reviewed the triage vital signs and the nursing notes.  Pertinent labs & imaging results that were available during my care of the patient were reviewed by me and considered in my medical decision making (see chart for details).  Patient presents with left shoulder pain that is been present for the past 3 weeks.  She also complains of numbness and tingling, suggestive of nerve involvement.  Cannot rule out nerve impingement, will defer imaging at this time, shared decision making with patient.  Toradol 30 mg IM and Decadron 10 mg IM administered for nerve inflammation.  Diclofenac sodium 1% gel prescribed to apply topically to the shoulder as needed.  Supportive care recommendations were provided and discussed with the patient to include the use of ice or heat, and gentle range of motion exercises.  Discussed indications with the patient and when follow-up be necessary.  Advised patient that if symptoms fail to improve, recommend following up with orthopedics for further evaluation.  Patient was in agreement with  this plan of care and verbalized understanding.  All questions were answered.  Patient stable for discharge.  Final Clinical Impressions(s) / UC Diagnoses   Final diagnoses:  None   Discharge Instructions   None    ED Prescriptions   None    PDMP not reviewed this encounter.   Abran Cantor, NP 01/28/23 [phone]

## 2023-01-28 NOTE — Discharge Instructions (Addendum)
You were given injections of Decadron 10 mg and Toradol 30 mg.  Do not take any additional ibuprofen today.  You may take Tylenol arthritis strength as needed for breakthrough pain or discomfort. Apply medication as prescribed. Apply ice or heat as needed.  Apply ice for pain or swelling, heat for spasm or stiffness.  Apply for 20 minutes, remove for 1 hour, repeat as needed. Gentle range of motion exercises have been provided for you to perform at least twice daily. As discussed, if symptoms do not improve with this treatment, would recommend that you follow-up with orthopedics for further evaluation.  I have given you information for [facility] and for [facility]. Follow-up as needed.

## 2023-01-28 NOTE — ED Triage Notes (Signed)
Patient reports left shoulder pain x 3 weeks. Denies any recent injuries or trauma.

## 2023-01-29 ENCOUNTER — Telehealth: Payer: Self-pay

## 2023-01-29 MED ORDER — DICLOFENAC SODIUM 1 % EX GEL: 4.0000 g | Freq: Four times a day (QID) | CUTANEOUS | 0 refills | Status: AC

## 2023-01-29 NOTE — Telephone Encounter (Signed)
Pt called stated she was seen on last night and was given a rx for diclofenac gel, but was told the pharmacy did not receive rx. Order is being resent tot the [facility] on [address] in [place]

## 2023-02-11 DIAGNOSIS — E663 Overweight: Secondary | ICD-10-CM | POA: Diagnosis not present

## 2023-02-11 DIAGNOSIS — Z6829 Body mass index (BMI) 29.0-29.9, adult: Secondary | ICD-10-CM | POA: Diagnosis not present

## 2023-02-11 DIAGNOSIS — N390 Urinary tract infection, site not specified: Secondary | ICD-10-CM | POA: Diagnosis not present

## 2023-02-11 DIAGNOSIS — R350 Frequency of micturition: Secondary | ICD-10-CM | POA: Diagnosis not present

## 2023-02-11 DIAGNOSIS — M5030 Other cervical disc degeneration, unspecified cervical region: Secondary | ICD-10-CM | POA: Diagnosis not present

## 2023-06-10 ENCOUNTER — Encounter: Payer: Self-pay | Admitting: Emergency Medicine

## 2023-06-10 ENCOUNTER — Ambulatory Visit
Admission: EM | Admit: 2023-06-10 | Discharge: 2023-06-10 | Disposition: A | Discharge status: Home / Self Care | Attending: Family Medicine | Admitting: Family Medicine

## 2023-06-10 ENCOUNTER — Other Ambulatory Visit: Payer: Self-pay

## 2023-06-10 DIAGNOSIS — J069 Acute upper respiratory infection, unspecified: Secondary | ICD-10-CM | POA: Diagnosis not present

## 2023-06-10 DIAGNOSIS — H6993 Unspecified Eustachian tube disorder, bilateral: Secondary | ICD-10-CM

## 2023-06-10 DIAGNOSIS — R490 Dysphonia: Secondary | ICD-10-CM | POA: Diagnosis not present

## 2023-06-10 LAB — POC COVID19/FLU A&B COMBO
Covid Antigen, POC: NEGATIVE
Influenza A Antigen, POC: NEGATIVE
Influenza B Antigen, POC: NEGATIVE

## 2023-06-10 MED ORDER — DEXAMETHASONE SODIUM PHOSPHATE 10 MG/ML IJ SOLN
10.0000 mg | Freq: Once | INTRAMUSCULAR | Status: AC
  Administered 2023-06-10: 10 mg via INTRAMUSCULAR

## 2023-06-10 MED ORDER — AZELASTINE HCL 0.1 % NA SOLN: 1.0000 | Freq: Two times a day (BID) | NASAL | 0 refills | Status: AC

## 2023-06-10 MED ORDER — LIDOCAINE VISCOUS HCL 2 % MT SOLN: 10.0000 mL | OROMUCOSAL | 0 refills | Status: DC | PRN

## 2023-06-10 MED ORDER — PROMETHAZINE-DM 6.25-15 MG/5ML PO SYRP: 5.0000 mL | ORAL_SOLUTION | Freq: Four times a day (QID) | ORAL | 0 refills | Status: DC | PRN

## 2023-06-10 NOTE — ED Provider Notes (Signed)
 [facility]    CSN: [id number] Arrival date & time: 06/10/23  2440      History   Chief Complaint Chief Complaint  Patient presents with   Nasal Congestion    HPI Tiffany Underwood is a 53 y.o. female.   Patient presenting today with 4-day history of runny nose, cough, sinus pressure, bilateral ear pain and pressure, hoarseness, fatigue.  Denies fever, chills, chest pain, abdominal pain, vomiting, diarrhea.  So far trying cold and congestion medication with minimal relief.  No known history of chronic pulmonary disease.    Past Medical History:  Diagnosis Date   Angio-edema    Primary sent here to determine   Fibroids    Thyroid  disease    Urticaria     Patient Active Problem List   Diagnosis Date Noted   Vaginal mass 10/09/2022   RLQ abdominal pain 10/09/2022   History of UTI 07/30/2022   Encounter for gynecological examination with Papanicolaou smear of cervix 07/30/2022   Fibroid 03/01/2022   DUB (dysfunctional uterine bleeding) 03/01/2022   Right-sided low back pain without sciatica 03/01/2022   Pregnancy examination or test, negative result 03/01/2022   Abnormal urine odor 07/26/2021   Hematuria 07/26/2021   Hypothyroidism 07/26/2021   Encounter for well woman exam with routine gynecological exam 07/17/2020   Encounter for screening fecal occult blood testing 07/17/2020   Screening for colorectal cancer 07/17/2020   Acute left-sided low back pain without sciatica 07/17/2020   Encounter for surveillance of vaginal ring hormonal contraceptive device 07/17/2020   Elevated blood-pressure reading without diagnosis of hypertension 12/20/2014   Idiopathic urticaria 11/16/2014   Angioedema 11/16/2014   Allergic rhinitis 11/16/2014   Urinary frequency 10/25/2014   Chronic idiopathic constipation 10/25/2014   Encounter for routine gynecological examination 10/07/2012    Past Surgical History:  Procedure Laterality Date   COLONOSCOPY WITH PROPOFOL  N/A  09/19/2020   Procedure: COLONOSCOPY WITH PROPOFOL ;  Surgeon: Vinetta Greening, DO;  Location: [facility];  Service: Endoscopy;  Laterality: N/A;  ASA II / 10:00   UTERINE FIBROID SURGERY      OB History     Gravida  1   Para  1   Term      Preterm      AB      Living  1      SAB      IAB      Ectopic      Multiple      Live Births  1            Home Medications    Prior to Admission medications   Medication Sig Start Date End Date Taking? Authorizing Provider  azelastine (ASTELIN) 0.1 % nasal spray Place 1 spray into both nostrils 2 (two) times daily. Use in each nostril as directed 06/10/23  Yes Corbin Dess, PA-C  lidocaine  (XYLOCAINE ) 2 % solution Use as directed 10 mLs in the mouth or throat every 3 (three) hours as needed. 06/10/23  Yes Corbin Dess, PA-C  promethazine-dextromethorphan (PROMETHAZINE-DM) 6.25-15 MG/5ML syrup Take 5 mLs by mouth 4 (four) times daily as needed. 06/10/23  Yes Corbin Dess, PA-C  albuterol  (VENTOLIN  HFA) 108 (90 Base) MCG/ACT inhaler Inhale 2 puffs into the lungs every 6 (six) hours as needed for wheezing or shortness of breath. 01/26/21   Corbin Dess, PA-C  ALPRAZolam (XANAX XR) 1 MG 24 hr tablet Take 1 mg by mouth in the morning.  [provider]  cetirizine  (ZYRTEC  ALLERGY) 10 MG tablet Take 1 tablet (10 mg total) by mouth daily. 01/26/21   Corbin Dess, PA-C  clobetasol ointment (TEMOVATE) 0.05 % Apply 1 application topically 2 (two) times daily as needed (skin reaction (sun exposure)).    [provider]  diclofenac  Sodium (VOLTAREN  ARTHRITIS PAIN) 1 % GEL Apply 4 g topically 4 (four) times daily. 01/29/23   Corbin Dess, PA-C  fluticasone  (FLONASE ) 50 MCG/ACT nasal spray Place 1 spray into both nostrils 2 (two) times daily. 11/19/21   Corbin Dess, PA-C  HALOETTE 0.12-0.015 MG/24HR vaginal ring INSERT 1 RING VAGINALLY FOR 24 DAYS  CONSECUTIVELY, THEN INSERT NEW RING AFTER 3 DAYS 08/19/22   Javan Messing, NP  levothyroxine (SYNTHROID, LEVOTHROID) 125 MCG tablet Take 125 mcg by mouth daily before breakfast.    [provider]  phenazopyridine  (PYRIDIUM ) 100 MG tablet Take 1 tablet (100 mg total) by mouth 3 (three) times daily as needed for pain. 01/22/23   Leath-Warren, Belen Bowers, NP  phentermine (ADIPEX-P) 37.5 MG tablet Take 37.5 mg by mouth daily before breakfast. Patient not taking: Reported on 12/09/2022    [provider]  diphenhydrAMINE  (BENADRYL ) 25 MG tablet Take 1 tablet (25 mg total) by mouth every 6 (six) hours. 01/28/15 01/12/19  Earma Gloss, MD    Family History Family History  Problem Relation Age of Onset   Cancer Mother 46       breast   Cancer Maternal Grandmother        breast    Social History Social History   Tobacco Use   Smoking status: Never   Smokeless tobacco: Never  Vaping Use   Vaping status: Never Used  Substance Use Topics   Alcohol use: No   Drug use: No     Allergies   Nitrofuran derivatives and Sulfa  antibiotics   Review of Systems Review of Systems Per HPI  Physical Exam Triage Vital Signs ED Triage Vitals [06/10/23 0907]  Encounter Vitals Group     BP (!) 140/93     Systolic BP Percentile      Diastolic BP Percentile      Pulse Rate 73     Resp 20     Temp 98.4 F (36.9 C)     Temp Source Oral     SpO2 97 %     Weight      Height      Head Circumference      Peak Flow      Pain Score 4     Pain Loc      Pain Education      Exclude from Growth Chart    No data found.  Updated Vital Signs BP (!) 140/93 (BP Location: Right Arm)   Pulse 73   Temp 98.4 F (36.9 C) (Oral)   Resp 20   LMP 06/01/2023 (Approximate)   SpO2 97%   Visual Acuity Right Eye Distance:   Left Eye Distance:   Bilateral Distance:    Right Eye Near:   Left Eye Near:    Bilateral Near:     Physical Exam Vitals and nursing note reviewed.   Constitutional:      Appearance: Normal appearance.  HENT:     Head: Atraumatic.     Right Ear: External ear normal.     Left Ear: External ear normal.     Ears:     Comments: Bilateral middle ear effusion  Nose: Rhinorrhea present.     Mouth/Throat:     Mouth: Mucous membranes are moist.     Pharynx: Posterior oropharyngeal erythema present.  Eyes:     Extraocular Movements: Extraocular movements intact.     Conjunctiva/sclera: Conjunctivae normal.  Cardiovascular:     Rate and Rhythm: Normal rate and regular rhythm.     Heart sounds: Normal heart sounds.  Pulmonary:     Effort: Pulmonary effort is normal.     Breath sounds: Normal breath sounds. No wheezing.  Musculoskeletal:        General: Normal range of motion.     Cervical back: Normal range of motion and neck supple.  Skin:    General: Skin is warm and dry.  Neurological:     Mental Status: She is alert and oriented to person, place, and time.  Psychiatric:        Mood and Affect: Mood normal.        Thought Content: Thought content normal.      UC Treatments / Results  Labs (all labs ordered are listed, but only abnormal results are displayed) Labs Reviewed  POC COVID19/FLU A&B COMBO    EKG   Radiology No results found.  Procedures Procedures (including critical care time)  Medications Ordered in UC Medications  dexamethasone  (DECADRON ) injection 10 mg (10 mg Intramuscular Given 06/10/23 1004)    Initial Impression / Assessment and Plan / UC Course  I have reviewed the triage vital signs and the nursing notes.  Pertinent labs & imaging results that were available during my care of the patient were reviewed by me and considered in my medical decision making (see chart for details).     Treat with IM Decadron , Astelin, viscous lidocaine , Phenergan DM, supportive over-the-counter medications and home care.  Return for worsening symptoms.  COVID and flu testing negative.  Final Clinical  Impressions(s) / UC Diagnoses   Final diagnoses:  Viral URI with cough  Hoarseness  Eustachian tube dysfunction, bilateral   Discharge Instructions   None    ED Prescriptions     Medication Sig Dispense Auth. Provider   azelastine (ASTELIN) 0.1 % nasal spray Place 1 spray into both nostrils 2 (two) times daily. Use in each nostril as directed 30 mL Corbin Dess, PA-C   lidocaine  (XYLOCAINE ) 2 % solution Use as directed 10 mLs in the mouth or throat every 3 (three) hours as needed. 100 mL Corbin Dess, PA-C   promethazine-dextromethorphan (PROMETHAZINE-DM) 6.25-15 MG/5ML syrup Take 5 mLs by mouth 4 (four) times daily as needed. 100 mL Corbin Dess, [place]      PDMP not reviewed this encounter.   Corbin Dess, [place] 06/10/23 1050

## 2023-06-10 NOTE — ED Triage Notes (Signed)
 Pt reports runny nose, cough, nasal congestion, hoarseness, nasal "burning" sensation since Saturday. Denies any known fever.

## 2023-06-11 ENCOUNTER — Telehealth: Admitting: Physician Assistant

## 2023-06-11 DIAGNOSIS — J069 Acute upper respiratory infection, unspecified: Secondary | ICD-10-CM | POA: Diagnosis not present

## 2023-06-11 MED ORDER — BENZONATATE 100 MG PO CAPS: 100.0000 mg | ORAL_CAPSULE | Freq: Three times a day (TID) | ORAL | 0 refills | Status: DC | PRN

## 2023-06-11 NOTE — Progress Notes (Signed)

## 2023-06-11 NOTE — Progress Notes (Signed)
 I have spent 5 minutes in review of e-visit questionnaire, review and updating patient chart, medical decision making and response to patient.   Piedad Climes, PA-C

## 2023-06-16 DIAGNOSIS — R7309 Other abnormal glucose: Secondary | ICD-10-CM | POA: Diagnosis not present

## 2023-06-16 DIAGNOSIS — E890 Postprocedural hypothyroidism: Secondary | ICD-10-CM | POA: Diagnosis not present

## 2023-06-16 DIAGNOSIS — F419 Anxiety disorder, unspecified: Secondary | ICD-10-CM | POA: Diagnosis not present

## 2023-06-16 DIAGNOSIS — Z6828 Body mass index (BMI) 28.0-28.9, adult: Secondary | ICD-10-CM | POA: Diagnosis not present

## 2023-07-24 ENCOUNTER — Ambulatory Visit
Admission: RE | Admit: 2023-07-24 | Discharge: 2023-07-24 | Disposition: A | Discharge status: Home / Self Care | Source: Ambulatory Visit | Attending: Nurse Practitioner | Admitting: Nurse Practitioner

## 2023-07-24 VITALS — BP 144/87 | HR 79 | Temp 98.4°F | Resp 18

## 2023-07-24 DIAGNOSIS — N3000 Acute cystitis without hematuria: Secondary | ICD-10-CM | POA: Insufficient documentation

## 2023-07-24 DIAGNOSIS — Z87440 Personal history of urinary (tract) infections: Secondary | ICD-10-CM | POA: Diagnosis not present

## 2023-07-24 LAB — POCT URINALYSIS DIP (MANUAL ENTRY)
Bilirubin, UA: NEGATIVE
Glucose, UA: NEGATIVE mg/dL
Nitrite, UA: POSITIVE — AB
Protein Ur, POC: NEGATIVE mg/dL
Spec Grav, UA: 1.025 (ref 1.010–1.025)
Urobilinogen, UA: 0.2 U/dL
pH, UA: 6 (ref 5.0–8.0)

## 2023-07-24 MED ORDER — CEPHALEXIN 500 MG PO CAPS: 500.0000 mg | ORAL_CAPSULE | Freq: Four times a day (QID) | ORAL | 0 refills | Status: DC

## 2023-07-24 NOTE — Discharge Instructions (Addendum)
 Your urinalysis shows that you do have a urinary tract infection.  A urine culture has been ordered to ensure you are being treated with the appropriate antibiotic.  If the medication needs to be changed, you will be contacted.  You will also have access to your results via MyChart. Take medication as prescribed. Make sure you are urinating at least every 2 hours. Avoid caffeine such as tea, soda, and coffee while symptoms persist. If you are sexually active, make sure you are urinating at least 15 to 20 minutes after sexual intercourse. Continue to drink plenty of fluids. Go to the emergency department immediately if you experience worsening urinary symptoms with new symptoms of fever, chills, pain around your kidneys, or other concerns. Follow-up as needed.

## 2023-07-24 NOTE — ED Triage Notes (Signed)
 Urinary urgency and pain on urination since Monday.  Has been taking AZO.

## 2023-07-24 NOTE — ED Provider Notes (Signed)
 [facility]    CSN: [id number] Arrival date & time: 07/24/23  1758      History   Chief Complaint No chief complaint on file.   HPI Tiffany Underwood is a 53 y.o. female.   The history is provided by the patient.   Patient presents for complaints of urinary urgency, frequency, and suprapubic pressure that started over the past week. Patient denies fever, chills, hematuria, decreased urine stream, flank pain, low back pain, or vaginal symptoms.  Patient reports that she recently vacation, and went for longer periods of time without urinating.  States her last UTI was in December of last year.  States she has been taking Azo for her symptoms.   Denies prior history of kidney stones, or kidney infection.  Past Medical History:  Diagnosis Date   Angio-edema    Primary sent here to determine   Fibroids    Thyroid  disease    Urticaria     Patient Active Problem List   Diagnosis Date Noted   Vaginal mass 10/09/2022   RLQ abdominal pain 10/09/2022   History of UTI 07/30/2022   Encounter for gynecological examination with Papanicolaou smear of cervix 07/30/2022   Fibroid 03/01/2022   DUB (dysfunctional uterine bleeding) 03/01/2022   Right-sided low back pain without sciatica 03/01/2022   Pregnancy examination or test, negative result 03/01/2022   Abnormal urine odor 07/26/2021   Hematuria 07/26/2021   Hypothyroidism 07/26/2021   Encounter for well woman exam with routine gynecological exam 07/17/2020   Encounter for screening fecal occult blood testing 07/17/2020   Screening for colorectal cancer 07/17/2020   Acute left-sided low back pain without sciatica 07/17/2020   Encounter for surveillance of vaginal ring hormonal contraceptive device 07/17/2020   Elevated blood-pressure reading without diagnosis of hypertension 12/20/2014   Idiopathic urticaria 11/16/2014   Angioedema 11/16/2014   Allergic rhinitis 11/16/2014   Urinary frequency 10/25/2014   Chronic  idiopathic constipation 10/25/2014   Encounter for routine gynecological examination 10/07/2012    Past Surgical History:  Procedure Laterality Date   COLONOSCOPY WITH PROPOFOL  N/A 09/19/2020   Procedure: COLONOSCOPY WITH PROPOFOL ;  Surgeon: Vinetta Greening, DO;  Location: [facility];  Service: Endoscopy;  Laterality: N/A;  ASA II / 10:00   UTERINE FIBROID SURGERY      OB History     Gravida  1   Para  1   Term      Preterm      AB      Living  1      SAB      IAB      Ectopic      Multiple      Live Births  1            Home Medications    Prior to Admission medications   Medication Sig Start Date End Date Taking? Authorizing Provider  cephALEXin  (KEFLEX ) 500 MG capsule Take 1 capsule (500 mg total) by mouth 4 (four) times daily. 07/24/23  Yes Leath-Warren, Belen Bowers, NP  albuterol  (VENTOLIN  HFA) 108 (90 Base) MCG/ACT inhaler Inhale 2 puffs into the lungs every 6 (six) hours as needed for wheezing or shortness of breath. 01/26/21   Corbin Dess, PA-C  ALPRAZolam (XANAX XR) 1 MG 24 hr tablet Take 1 mg by mouth in the morning.    [provider]  azelastine  (ASTELIN ) 0.1 % nasal spray Place 1 spray into both nostrils 2 (two) times daily. Use in  each nostril as directed 06/10/23   Corbin Dess, PA-C  benzonatate  (TESSALON ) 100 MG capsule Take 1 capsule (100 mg total) by mouth 3 (three) times daily as needed for cough. 06/11/23   Farris Hong, PA-C  cetirizine  (ZYRTEC  ALLERGY) 10 MG tablet Take 1 tablet (10 mg total) by mouth daily. 01/26/21   Corbin Dess, PA-C  clobetasol ointment (TEMOVATE) 0.05 % Apply 1 application topically 2 (two) times daily as needed (skin reaction (sun exposure)).    [provider]  diclofenac  Sodium (VOLTAREN  ARTHRITIS PAIN) 1 % GEL Apply 4 g topically 4 (four) times daily. 01/29/23   Corbin Dess, PA-C  fluticasone  (FLONASE ) 50 MCG/ACT nasal spray Place 1 spray into both  nostrils 2 (two) times daily. 11/19/21   Corbin Dess, PA-C  HALOETTE 0.12-0.015 MG/24HR vaginal ring INSERT 1 RING VAGINALLY FOR 24 DAYS CONSECUTIVELY, THEN INSERT NEW RING AFTER 3 DAYS 08/19/22   Javan Messing, NP  levothyroxine (SYNTHROID, LEVOTHROID) 125 MCG tablet Take 125 mcg by mouth daily before breakfast.    [provider]  phenazopyridine  (PYRIDIUM ) 100 MG tablet Take 1 tablet (100 mg total) by mouth 3 (three) times daily as needed for pain. 01/22/23   Leath-Warren, Belen Bowers, NP  phentermine (ADIPEX-P) 37.5 MG tablet Take 37.5 mg by mouth daily before breakfast. Patient not taking: Reported on 12/09/2022    [provider]  diphenhydrAMINE  (BENADRYL ) 25 MG tablet Take 1 tablet (25 mg total) by mouth every 6 (six) hours. 01/28/15 01/12/19  Earma Gloss, MD    Family History Family History  Problem Relation Age of Onset   Cancer Mother 15       breast   Cancer Maternal Grandmother        breast    Social History Social History   Tobacco Use   Smoking status: Never   Smokeless tobacco: Never  Vaping Use   Vaping status: Never Used  Substance Use Topics   Alcohol use: No   Drug use: No     Allergies   Nitrofuran derivatives and Sulfa  antibiotics   Review of Systems Review of Systems Per HPI  Physical Exam Triage Vital Signs ED Triage Vitals  Encounter Vitals Group     BP 07/24/23 1811 (!) 144/87     Girls Systolic BP Percentile --      Girls Diastolic BP Percentile --      Boys Systolic BP Percentile --      Boys Diastolic BP Percentile --      Pulse Rate 07/24/23 1811 79     Resp 07/24/23 1811 18     Temp 07/24/23 1811 98.4 F (36.9 C)     Temp Source 07/24/23 1811 Oral     SpO2 07/24/23 1811 99 %     Weight --      Height --      Head Circumference --      Peak Flow --      Pain Score 07/24/23 1812 0     Pain Loc --      Pain Education --      Exclude from Growth Chart --    No data found.  Updated Vital  Signs BP (!) 144/87 (BP Location: Right Arm)   Pulse 79   Temp 98.4 F (36.9 C) (Oral)   Resp 18   LMP 06/09/2023 (Exact Date)   SpO2 99%   Visual Acuity Right Eye Distance:   Left Eye Distance:  Bilateral Distance:    Right Eye Near:   Left Eye Near:    Bilateral Near:     Physical Exam Vitals and nursing note reviewed.  Constitutional:      General: She is not in acute distress.    Appearance: Normal appearance.  HENT:     Head: Normocephalic.   Eyes:     Extraocular Movements: Extraocular movements intact.     Pupils: Pupils are equal, round, and reactive to light.    Cardiovascular:     Rate and Rhythm: Normal rate and regular rhythm.     Pulses: Normal pulses.     Heart sounds: Normal heart sounds.  Pulmonary:     Effort: Pulmonary effort is normal.     Breath sounds: Normal breath sounds.  Abdominal:     General: Abdomen is flat.     Palpations: Abdomen is soft.     Tenderness: There is no abdominal tenderness. There is no right CVA tenderness or left CVA tenderness.   Musculoskeletal:     Cervical back: Normal range of motion.  Lymphadenopathy:     Cervical: No cervical adenopathy.   Skin:    General: Skin is warm and dry.   Neurological:     General: No focal deficit present.     Mental Status: She is alert and oriented to person, place, and time.   Psychiatric:        Mood and Affect: Mood normal.        Behavior: Behavior normal.      UC Treatments / Results  Labs (all labs ordered are listed, but only abnormal results are displayed) Labs Reviewed  POCT URINALYSIS DIP (MANUAL ENTRY) - Abnormal; Notable for the following components:      Result Value   Clarity, UA cloudy (*)    Ketones, POC UA trace (5) (*)    Blood, UA large (*)    Nitrite, UA Positive (*)    Leukocytes, UA Small (1+) (*)    All other components within normal limits  URINE CULTURE    EKG   Radiology No results found.  Procedures Procedures (including  critical care time)  Medications Ordered in UC Medications - No data to display  Initial Impression / Assessment and Plan / UC Course  I have reviewed the triage vital signs and the nursing notes.  Pertinent labs & imaging results that were available during my care of the patient were reviewed by me and considered in my medical decision making (see chart for details).  Urinalysis is positive for leukocytes, nitrites, and blood, consistent with acute cystitis.  Will treat empirically for complicated UTI given her history with Keflex  500 mg 4 times daily while symptoms persist. Supportive care recommendations were provided and discussed with the patient to include over-the-counter analgesics, increasing fluids, avoiding caffeine and toileting every 2 hours. Patient was advised to follow-up with her PCP if urine culture is negative and she is continue to experience symptoms. Patient was advised to follow-up in this clinic if symptoms worsen. Patient was in agreement with this plan of care and verbalizes understanding. All questions were answered. Patient stable for discharge.    Final Clinical Impressions(s) / UC Diagnoses   Final diagnoses:  Acute cystitis without hematuria  History of recurrent UTIs     Discharge Instructions      Your urinalysis shows that you do have a urinary tract infection.  A urine culture has been ordered to ensure you are being treated with  the appropriate antibiotic.  If the medication needs to be changed, you will be contacted.  You will also have access to your results via MyChart. Take medication as prescribed. Make sure you are urinating at least every 2 hours. Avoid caffeine such as tea, soda, and coffee while symptoms persist. If you are sexually active, make sure you are urinating at least 15 to 20 minutes after sexual intercourse. Continue to drink plenty of fluids. Go to the emergency department immediately if you experience worsening urinary symptoms  with new symptoms of fever, chills, pain around your kidneys, or other concerns. Follow-up as needed.     ED Prescriptions     Medication Sig Dispense Auth. Provider   cephALEXin  (KEFLEX ) 500 MG capsule Take 1 capsule (500 mg total) by mouth 4 (four) times daily. 28 capsule Leath-Warren, Belen Bowers, NP      PDMP not reviewed this encounter.   Hardy Lia, NP 07/24/23 1910

## 2023-07-26 LAB — URINE CULTURE: Culture: 90000 — AB

## 2023-07-28 ENCOUNTER — Ambulatory Visit (HOSPITAL_COMMUNITY): Payer: Self-pay

## 2023-08-06 ENCOUNTER — Ambulatory Visit: Admitting: Adult Health

## 2023-08-06 ENCOUNTER — Other Ambulatory Visit (HOSPITAL_COMMUNITY)
Admission: RE | Admit: 2023-08-06 | Discharge: 2023-08-06 | Disposition: A | Discharge status: Home / Self Care | Source: Ambulatory Visit | Attending: Adult Health | Admitting: Adult Health

## 2023-08-06 ENCOUNTER — Encounter: Payer: Self-pay | Admitting: Adult Health

## 2023-08-06 VITALS — BP 141/92 | HR 67 | Ht 67.0 in | Wt 178.5 lb

## 2023-08-06 DIAGNOSIS — Z01419 Encounter for gynecological examination (general) (routine) without abnormal findings: Secondary | ICD-10-CM

## 2023-08-06 DIAGNOSIS — Z1331 Encounter for screening for depression: Secondary | ICD-10-CM | POA: Diagnosis not present

## 2023-08-06 DIAGNOSIS — R030 Elevated blood-pressure reading, without diagnosis of hypertension: Secondary | ICD-10-CM | POA: Diagnosis not present

## 2023-08-06 DIAGNOSIS — N898 Other specified noninflammatory disorders of vagina: Secondary | ICD-10-CM

## 2023-08-06 DIAGNOSIS — Z1211 Encounter for screening for malignant neoplasm of colon: Secondary | ICD-10-CM | POA: Diagnosis not present

## 2023-08-06 DIAGNOSIS — Z3044 Encounter for surveillance of vaginal ring hormonal contraceptive device: Secondary | ICD-10-CM

## 2023-08-06 LAB — HEMOCCULT GUIAC POC 1CARD (OFFICE): Fecal Occult Blood, POC: NEGATIVE

## 2023-08-06 MED ORDER — ETONOGESTREL-ETHINYL ESTRADIOL 0.12-0.015 MG/24HR VA RING: VAGINAL_RING | VAGINAL | 4 refills | Status: DC

## 2023-08-06 NOTE — Progress Notes (Signed)
 Patient ID: Tiffany Underwood, female   DOB: 04-Sep-1970, 53 y.o.   MRN: [id number] History of Present Illness: Tiffany Underwood is a 53 year old black female,married, G1P1 in for a well woman gyn exam and pap. She has pain left neck radiates down arm has seen PCP and had injection, but will call him back.   PCP is Dr Marvine   Current Medications, Allergies, Past Medical History, Past Surgical History, Family History and Social History were reviewed in [employer] electronic medical record.     Review of Systems: Patient denies any headaches, hearing loss, fatigue, blurred vision, shortness of breath, chest pain, abdominal pain, problems with bowel movements, urination, or intercourse. No joint pain or mood swings.  See HPI for positives    Physical Exam:BP (!) 141/92 (BP Location: Right Arm, Patient Position: Sitting, Cuff Size: Normal)   Pulse 67   Ht 5' 7 (1.702 m)   Wt 178 lb 8 oz (81 kg)   LMP 07/26/2023 (Exact Date)   BMI 27.96 kg/m   General:  Well developed, well nourished, no acute distress Skin:  Warm and dry Neck:  Midline trachea, normal thyroid , good ROM, no lymphadenopathy Lungs; Clear to auscultation bilaterally Breast:  No dominant palpable mass, retraction, or nipple discharge Cardiovascular: Regular rate and rhythm Abdomen:  Soft, non tender, no hepatosplenomegaly Pelvic:  External genitalia is normal in appearance, no lesions.  The vagina is normal in appearance, has marble sized mobile mass at about 11 o'clock in vagina, non tender and stable. Nuva ring in place. Urethra has no lesions or masses. The cervix is bulbous.  Uterus is felt to be normal size, shape, and contour.  No adnexal masses or tenderness noted.Bladder is non tender, no masses felt. Rectal: Good sphincter tone, no polyps, or hemorrhoids felt.  Hemoccult negative. Extremities/musculoskeletal:  No swelling or varicosities noted, no clubbing or cyanosis Psych:  No mood changes, alert and cooperative,seems  happy AA is 0 Fall risk is low    08/06/2023    9:38 AM 07/30/2022   10:56 AM 07/26/2021   11:41 AM  Depression screen PHQ 2/9  Decreased Interest 1 0 0  Down, Depressed, Hopeless 0 0 0  PHQ - 2 Score 1 0 0  Altered sleeping 0 0 2  Tired, decreased energy 1 1 2   Change in appetite 0 0 0  Feeling bad or failure about yourself  0 0 0  Trouble concentrating 0 0 0  Moving slowly or fidgety/restless 0 0 0  Suicidal thoughts 0 0 0  PHQ-9 Score 2 1 4        08/06/2023    9:38 AM 07/30/2022   10:56 AM 07/26/2021   11:41 AM 07/17/2020    8:39 AM  GAD 7 : Generalized Anxiety Score  Nervous, Anxious, on Edge 1 1 0 0  Control/stop worrying 0 1 0 0  Worry too much - different things 1 1 0 0  Trouble relaxing 0 0 0 0  Restless 0 0 0 0  Easily annoyed or irritable 0 0 0 0  Afraid - awful might happen 0 0 0 0  Total GAD 7 Score 2 3 0 0      Upstream - 08/06/23 0936       Pregnancy Intention Screening   Does the patient want to become pregnant in the next year? No    Does the patient's partner want to become pregnant in the next year? No    Would the patient like to  discuss contraceptive options today? No      Contraception Wrap Up   Current Method Vaginal Ring    End Method Vaginal Ring    Contraception Counseling Provided Yes         Examination chaperoned by Clarita Salt LPN   Impression and plan: 1. Encounter for gynecological examination with Papanicolaou smear of cervix (Primary) Pap sent Pap in 3 years if normal Physical in 1 year Labs with PCP Colonoscopy per GI Mammogram was negative 11/11/22 - Cytology - PAP( [facility])  2. Encounter for screening fecal occult blood testing Hemoccult is negative  - POCT occult blood stool  3. Encounter for surveillance of vaginal ring hormonal contraceptive device Happy with nuva ring, will refill Meds ordered this encounter  Medications   etonogestrel -ethinyl estradiol  (HALOETTE) 0.12-0.015 MG/24HR vaginal ring    Sig:  Insert vaginally and leave in place for 3 consecutive weeks, then remove for 1 week.    Dispense:  3 each    Refill:  4    Supervising Provider:   JAYNE MINDER H [[id number]]     4. Vaginal mass Marble sized mobile mass at about 11 o'clock in vagina, non tender and stable  5. Elevated BP Follow up with PCP

## 2023-08-08 DIAGNOSIS — M5030 Other cervical disc degeneration, unspecified cervical region: Secondary | ICD-10-CM | POA: Diagnosis not present

## 2023-08-11 ENCOUNTER — Other Ambulatory Visit (HOSPITAL_COMMUNITY): Payer: Self-pay | Admitting: Family Medicine

## 2023-08-11 DIAGNOSIS — M5030 Other cervical disc degeneration, unspecified cervical region: Secondary | ICD-10-CM

## 2023-08-12 ENCOUNTER — Ambulatory Visit: Payer: Self-pay | Admitting: Adult Health

## 2023-08-12 ENCOUNTER — Telehealth: Payer: Self-pay | Admitting: *Deleted

## 2023-08-12 LAB — CYTOLOGY - PAP
Adequacy: ABSENT
Comment: NEGATIVE
Diagnosis: NEGATIVE
High risk HPV: NEGATIVE

## 2023-08-12 NOTE — Telephone Encounter (Signed)
 I received a PA for pt's vaginal ring. I called the insurance company and was told no PA was required. Will be a $0 copay. Pt received med on 07/25/23. Next refill needs to be 08/22/23 or after. Pt and [facility] on [address] aware. JSY

## 2023-08-22 ENCOUNTER — Ambulatory Visit (HOSPITAL_COMMUNITY)

## 2023-08-22 ENCOUNTER — Encounter (HOSPITAL_COMMUNITY): Payer: Self-pay

## 2023-09-04 DIAGNOSIS — M50223 Other cervical disc displacement at C6-C7 level: Secondary | ICD-10-CM | POA: Diagnosis not present

## 2023-09-04 DIAGNOSIS — M4802 Spinal stenosis, cervical region: Secondary | ICD-10-CM | POA: Diagnosis not present

## 2023-10-01 DIAGNOSIS — M5030 Other cervical disc degeneration, unspecified cervical region: Secondary | ICD-10-CM | POA: Diagnosis not present

## 2023-10-01 DIAGNOSIS — M5412 Radiculopathy, cervical region: Secondary | ICD-10-CM | POA: Diagnosis not present

## 2023-10-14 ENCOUNTER — Other Ambulatory Visit (HOSPITAL_COMMUNITY): Payer: Self-pay | Admitting: Family Medicine

## 2023-10-14 DIAGNOSIS — Z1231 Encounter for screening mammogram for malignant neoplasm of breast: Secondary | ICD-10-CM

## 2023-10-20 DIAGNOSIS — M5412 Radiculopathy, cervical region: Secondary | ICD-10-CM | POA: Diagnosis not present

## 2023-10-20 DIAGNOSIS — R7309 Other abnormal glucose: Secondary | ICD-10-CM | POA: Diagnosis not present

## 2023-10-20 DIAGNOSIS — E890 Postprocedural hypothyroidism: Secondary | ICD-10-CM | POA: Diagnosis not present

## 2023-10-20 DIAGNOSIS — M5030 Other cervical disc degeneration, unspecified cervical region: Secondary | ICD-10-CM | POA: Diagnosis not present

## 2023-10-20 DIAGNOSIS — E039 Hypothyroidism, unspecified: Secondary | ICD-10-CM | POA: Diagnosis not present

## 2023-10-21 DIAGNOSIS — M5412 Radiculopathy, cervical region: Secondary | ICD-10-CM | POA: Diagnosis not present

## 2023-10-28 DIAGNOSIS — M5412 Radiculopathy, cervical region: Secondary | ICD-10-CM | POA: Diagnosis not present

## 2023-11-06 DIAGNOSIS — M5412 Radiculopathy, cervical region: Secondary | ICD-10-CM | POA: Diagnosis not present

## 2023-11-08 ENCOUNTER — Other Ambulatory Visit: Payer: Self-pay | Admitting: Adult Health

## 2023-11-12 DIAGNOSIS — Z6828 Body mass index (BMI) 28.0-28.9, adult: Secondary | ICD-10-CM | POA: Diagnosis not present

## 2023-11-12 DIAGNOSIS — M5412 Radiculopathy, cervical region: Secondary | ICD-10-CM | POA: Diagnosis not present

## 2023-11-17 ENCOUNTER — Ambulatory Visit (HOSPITAL_COMMUNITY)
Admission: RE | Admit: 2023-11-17 | Discharge: 2023-11-17 | Disposition: A | Discharge status: Home / Self Care | Source: Ambulatory Visit | Attending: Family Medicine | Admitting: Family Medicine

## 2023-11-17 DIAGNOSIS — Z1231 Encounter for screening mammogram for malignant neoplasm of breast: Secondary | ICD-10-CM | POA: Insufficient documentation

## 2023-12-30 DIAGNOSIS — E039 Hypothyroidism, unspecified: Secondary | ICD-10-CM | POA: Diagnosis not present

## 2023-12-30 DIAGNOSIS — F411 Generalized anxiety disorder: Secondary | ICD-10-CM | POA: Diagnosis not present

## 2023-12-30 DIAGNOSIS — R7309 Other abnormal glucose: Secondary | ICD-10-CM | POA: Diagnosis not present

## 2023-12-30 DIAGNOSIS — Z0000 Encounter for general adult medical examination without abnormal findings: Secondary | ICD-10-CM | POA: Diagnosis not present

## 2023-12-30 DIAGNOSIS — Z13220 Encounter for screening for lipoid disorders: Secondary | ICD-10-CM | POA: Diagnosis not present

## 2023-12-30 DIAGNOSIS — M5030 Other cervical disc degeneration, unspecified cervical region: Secondary | ICD-10-CM | POA: Diagnosis not present

## 2024-01-06 ENCOUNTER — Ambulatory Visit
Admission: RE | Admit: 2024-01-06 | Discharge: 2024-01-06 | Disposition: A | Discharge status: Home / Self Care | Attending: Family Medicine | Admitting: Family Medicine

## 2024-01-06 VITALS — BP 146/91 | HR 93 | Temp 97.3°F | Resp 18

## 2024-01-06 DIAGNOSIS — R3915 Urgency of urination: Secondary | ICD-10-CM | POA: Insufficient documentation

## 2024-01-06 DIAGNOSIS — N390 Urinary tract infection, site not specified: Secondary | ICD-10-CM | POA: Insufficient documentation

## 2024-01-06 LAB — POCT URINE DIPSTICK
Bilirubin, UA: NEGATIVE
Glucose, UA: NEGATIVE mg/dL
Ketones, POC UA: NEGATIVE mg/dL
Nitrite, UA: NEGATIVE
POC PROTEIN,UA: NEGATIVE
Spec Grav, UA: 1.01 (ref 1.010–1.025)
Urobilinogen, UA: 0.2 U/dL
pH, UA: 6 (ref 5.0–8.0)

## 2024-01-06 MED ORDER — CEPHALEXIN 500 MG PO CAPS: 500.0000 mg | ORAL_CAPSULE | Freq: Two times a day (BID) | ORAL | 0 refills | Status: AC

## 2024-01-06 MED ORDER — FLUCONAZOLE 150 MG PO TABS: 150.0000 mg | ORAL_TABLET | Freq: Once | ORAL | 0 refills | Status: AC

## 2024-01-06 NOTE — ED Triage Notes (Signed)
 Pt reports she has urinary urgency, foul smelling odor, and urgency x 4 days   Took azo

## 2024-01-06 NOTE — ED Provider Notes (Signed)
 [facility]    CSN: [id number] Arrival date & time: 01/06/24  1659      History   Chief Complaint No chief complaint on file.   HPI Tiffany Underwood is a 53 y.o. female.   Patient presenting today with 4-day history of urinary urgency, suprapubic pressure, foul-smelling urine.  Denies fever, chills, abdominal pain, vomiting, diarrhea, vaginal symptoms.  So far trying Azo with mild temporary benefit.    Past Medical History:  Diagnosis Date   Angio-edema    Primary sent here to determine   Fibroids    Thyroid  disease    Urticaria     Patient Active Problem List   Diagnosis Date Noted   Vaginal mass 10/09/2022   RLQ abdominal pain 10/09/2022   History of UTI 07/30/2022   Encounter for gynecological examination with Papanicolaou smear of cervix 07/30/2022   Fibroid 03/01/2022   DUB (dysfunctional uterine bleeding) 03/01/2022   Right-sided low back pain without sciatica 03/01/2022   Pregnancy examination or test, negative result 03/01/2022   Abnormal urine odor 07/26/2021   Hematuria 07/26/2021   Hypothyroidism 07/26/2021   Encounter for well woman exam with routine gynecological exam 07/17/2020   Encounter for screening fecal occult blood testing 07/17/2020   Screening for colorectal cancer 07/17/2020   Acute left-sided low back pain without sciatica 07/17/2020   Encounter for surveillance of vaginal ring hormonal contraceptive device 07/17/2020   Elevated blood-pressure reading without diagnosis of hypertension 12/20/2014   Idiopathic urticaria 11/16/2014   Angioedema 11/16/2014   Allergic rhinitis 11/16/2014   Urinary frequency 10/25/2014   Chronic idiopathic constipation 10/25/2014   Encounter for routine gynecological examination 10/07/2012    Past Surgical History:  Procedure Laterality Date   COLONOSCOPY WITH PROPOFOL  N/A 09/19/2020   Procedure: COLONOSCOPY WITH PROPOFOL ;  Surgeon: Cindie Carlin POUR, DO;  Location: [facility];  Service:  Endoscopy;  Laterality: N/A;  ASA II / 10:00   UTERINE FIBROID SURGERY      OB History     Gravida  1   Para  1   Term      Preterm      AB      Living  1      SAB      IAB      Ectopic      Multiple      Live Births  1            Home Medications    Prior to Admission medications   Medication Sig Start Date End Date Taking? Authorizing Provider  cephALEXin  (KEFLEX ) 500 MG capsule Take 1 capsule (500 mg total) by mouth 2 (two) times daily. 01/06/24  Yes Stuart Vernell Norris, PA-C  fluconazole  (DIFLUCAN ) 150 MG tablet Take 1 tablet (150 mg total) by mouth once for 1 dose. 01/06/24 01/06/24 Yes Stuart Vernell Norris, PA-C  albuterol  (VENTOLIN  HFA) 108 (90 Base) MCG/ACT inhaler Inhale 2 puffs into the lungs every 6 (six) hours as needed for wheezing or shortness of breath. 01/26/21   Stuart Vernell Norris, PA-C  ALPRAZolam (XANAX XR) 1 MG 24 hr tablet Take 1 mg by mouth in the morning.    [provider]  azelastine  (ASTELIN ) 0.1 % nasal spray Place 1 spray into both nostrils 2 (two) times daily. Use in each nostril as directed 06/10/23   Stuart Vernell Norris, PA-C  cetirizine  (ZYRTEC  ALLERGY) 10 MG tablet Take 1 tablet (10 mg total) by mouth daily. 01/26/21   Stuart,  Vernell Norris, PA-C  clobetasol ointment (TEMOVATE) 0.05 % Apply 1 application topically 2 (two) times daily as needed (skin reaction (sun exposure)).    [provider]  diclofenac  Sodium (VOLTAREN  ARTHRITIS PAIN) 1 % GEL Apply 4 g topically 4 (four) times daily. 01/29/23   Stuart Vernell Norris, PA-C  etonogestrel -ethinyl estradiol  (NUVARING) 0.12-0.015 MG/24HR vaginal ring INSERT 1 RING VAGINALLY FOR 24 DAYS, THEN INSERT NEW RING AFTER 3 DAYS. 11/10/23   Signa Delon LABOR, NP  fluticasone  (FLONASE ) 50 MCG/ACT nasal spray Place 1 spray into both nostrils 2 (two) times daily. 11/19/21   Stuart Vernell Norris, PA-C  levothyroxine (SYNTHROID) 100 MCG tablet Take 100 mcg by mouth  daily. 06/16/23   [provider]  diphenhydrAMINE  (BENADRYL ) 25 MG tablet Take 1 tablet (25 mg total) by mouth every 6 (six) hours. 01/28/15 01/12/19  RancourGarnette, MD    Family History Family History  Problem Relation Age of Onset   Cancer Mother 1       breast   Cancer Maternal Grandmother        breast    Social History Social History   Tobacco Use   Smoking status: Never   Smokeless tobacco: Never  Vaping Use   Vaping status: Never Used  Substance Use Topics   Alcohol use: No   Drug use: No     Allergies   Nitrofuran derivatives and Sulfa  antibiotics   Review of Systems Review of Systems PER HPI  Physical Exam Triage Vital Signs ED Triage Vitals  Encounter Vitals Group     BP 01/06/24 1702 (!) 146/91     Girls Systolic BP Percentile --      Girls Diastolic BP Percentile --      Boys Systolic BP Percentile --      Boys Diastolic BP Percentile --      Pulse Rate 01/06/24 1702 93     Resp 01/06/24 1702 18     Temp 01/06/24 1702 (!) 97.3 F (36.3 C)     Temp Source 01/06/24 1702 Oral     SpO2 01/06/24 1702 98 %     Weight --      Height --      Head Circumference --      Peak Flow --      Pain Score 01/06/24 1703 0     Pain Loc --      Pain Education --      Exclude from Growth Chart --    No data found.  Updated Vital Signs BP (!) 146/91 (BP Location: Right Arm)   Pulse 93   Temp (!) 97.3 F (36.3 C) (Oral)   Resp 18   LMP 12/16/2023 (Approximate)   SpO2 98%   Visual Acuity Right Eye Distance:   Left Eye Distance:   Bilateral Distance:    Right Eye Near:   Left Eye Near:    Bilateral Near:     Physical Exam Vitals and nursing note reviewed.  Constitutional:      Appearance: Normal appearance. She is not ill-appearing.  HENT:     Head: Atraumatic.  Eyes:     Extraocular Movements: Extraocular movements intact.     Conjunctiva/sclera: Conjunctivae normal.  Cardiovascular:     Rate and Rhythm: Normal rate.  Pulmonary:      Effort: Pulmonary effort is normal.  Abdominal:     General: Bowel sounds are normal. There is no distension.     Palpations: Abdomen is soft.  Tenderness: There is no abdominal tenderness. There is no right CVA tenderness, left CVA tenderness or guarding.  Musculoskeletal:        General: Normal range of motion.     Cervical back: Normal range of motion and neck supple.  Skin:    General: Skin is warm and dry.  Neurological:     Mental Status: She is alert and oriented to person, place, and time.  Psychiatric:        Mood and Affect: Mood normal.        Thought Content: Thought content normal.        Judgment: Judgment normal.     UC Treatments / Results  Labs (all labs ordered are listed, but only abnormal results are displayed) Labs Reviewed  POCT URINE DIPSTICK - Abnormal; Notable for the following components:      Result Value   Blood, UA small (*)    Leukocytes, UA Trace (*)    All other components within normal limits  URINE CULTURE    EKG   Radiology No results found.  Procedures Procedures (including critical care time)  Medications Ordered in UC Medications - No data to display  Initial Impression / Assessment and Plan / UC Course  I have reviewed the triage vital signs and the nursing notes.  Pertinent labs & imaging results that were available during my care of the patient were reviewed by me and considered in my medical decision making (see chart for details).     Urinalysis today with evidence of possible urinary tract infection.  Treat with Keflex , await urine culture and adjust if needed.  Requesting a Diflucan  to take with her antibiotic. Final Clinical Impressions(s) / UC Diagnoses   Final diagnoses:  Urinary urgency  Acute lower UTI   Discharge Instructions   None    ED Prescriptions     Medication Sig Dispense Auth. Provider   cephALEXin  (KEFLEX ) 500 MG capsule Take 1 capsule (500 mg total) by mouth 2 (two) times daily. 10  capsule Stuart Vernell Norris, PA-C   fluconazole  (DIFLUCAN ) 150 MG tablet Take 1 tablet (150 mg total) by mouth once for 1 dose. 1 tablet Stuart Vernell Norris, [place]      PDMP not reviewed this encounter.   Stuart Vernell Norris, PA-C 01/06/24 1730

## 2024-01-07 LAB — URINE CULTURE: Culture: <10000 — AB

## 2024-01-13 ENCOUNTER — Ambulatory Visit (HOSPITAL_COMMUNITY): Payer: Self-pay
